# Patient Record
Sex: Female | Born: 1985 | Race: White | Hispanic: No | Marital: Married | Smoking: Never smoker
Health system: Southern US, Community
[De-identification: ages and names within clinical notes are randomized; demographics above are authoritative.]

---

## 2012-12-25 ENCOUNTER — Ambulatory Visit: Payer: Self-pay | Admitting: Otolaryngology

## 2015-02-02 ENCOUNTER — Ambulatory Visit: Payer: Self-pay | Admitting: Neurology

## 2015-06-29 ENCOUNTER — Other Ambulatory Visit: Payer: Self-pay | Admitting: Unknown Physician Specialty

## 2015-06-29 DIAGNOSIS — J329 Chronic sinusitis, unspecified: Secondary | ICD-10-CM

## 2015-07-20 ENCOUNTER — Ambulatory Visit: Payer: Self-pay

## 2015-07-20 ENCOUNTER — Emergency Department
Admission: EM | Admit: 2015-07-20 | Discharge: 2015-07-20 | Disposition: A | Discharge status: Home / Self Care | Payer: BC Managed Care – PPO | Attending: Emergency Medicine | Admitting: Emergency Medicine

## 2015-07-20 ENCOUNTER — Encounter: Payer: Self-pay | Admitting: Emergency Medicine

## 2015-07-20 DIAGNOSIS — R079 Chest pain, unspecified: Secondary | ICD-10-CM | POA: Diagnosis not present

## 2015-07-20 DIAGNOSIS — F419 Anxiety disorder, unspecified: Secondary | ICD-10-CM | POA: Diagnosis not present

## 2015-07-20 LAB — CBC
HCT: 37.5 % (ref 35.0–47.0)
Hemoglobin: 12.7 g/dL (ref 12.0–16.0)
MCH: 29.8 pg (ref 26.0–34.0)
MCHC: 33.8 g/dL (ref 32.0–36.0)
MCV: 88.1 fL (ref 80.0–100.0)
Platelets: 263 10*3/uL (ref 150–440)
RBC: 4.26 MIL/uL (ref 3.80–5.20)
RDW: 13.8 % (ref 11.5–14.5)
WBC: 5.7 10*3/uL (ref 3.6–11.0)

## 2015-07-20 LAB — BASIC METABOLIC PANEL
Anion gap: 6 (ref 5–15)
BUN: 10 mg/dL (ref 6–20)
CALCIUM: 9.1 mg/dL (ref 8.9–10.3)
CO2: 27 mmol/L (ref 22–32)
CREATININE: 0.87 mg/dL (ref 0.44–1.00)
Chloride: 106 mmol/L (ref 101–111)
GFR calc Af Amer: >60 mL/min (ref >60)
GLUCOSE: 106 mg/dL — AB (ref 65–99)
Potassium: 3.6 mmol/L (ref 3.5–5.1)
Sodium: 139 mmol/L (ref 135–145)

## 2015-07-20 LAB — TROPONIN I

## 2015-07-20 LAB — FIBRIN DERIVATIVES D-DIMER (ARMC ONLY): Fibrin derivatives D-dimer (ARMC): 160 (ref 0–499)

## 2015-07-20 LAB — TSH: TSH: 1.979 u[IU]/mL (ref 0.350–4.500)

## 2015-07-20 MED ORDER — LORAZEPAM 0.5 MG PO TABS: 0.5000 mg | ORAL_TABLET | Freq: Three times a day (TID) | ORAL | Status: DC | PRN

## 2015-07-20 NOTE — ED Provider Notes (Signed)
Time Seen: Approximately.[facility]  I have reviewed the triage notes  Chief Complaint: Chest Pain and Numbness   History of Present Illness: Tricia Moran is a 29 y.o. female who presents with a 6 day history of intermittent to constant chest discomfort. She points mainly to the substernal region. She states that the chest discomfort seems to get worse when she gets anxious about her daughter and her speech development. 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 She denies any arm job back or flank discomfort. She denies any nausea, vomiting, shortness of breath, fever, chills, cough. She denies any obvious pleuritic or positional components to her pain. She states she's been prescribed antidepressants etc. in the past but would prefer to use natural supplements. She denies any pulmonary emboli risk factors  History reviewed. No pertinent past medical history. Negative for hypertension, high cholesterol, or diabetes There are no active problems to display for this patient.   History reviewed. No pertinent past surgical history.  History reviewed. No pertinent past surgical history.  No current outpatient prescriptions on file.  Allergies:  Review of patient's allergies indicates not on file.  Family History: No family history on file. No early cardiovascular disease Social History: Social History  Substance Use Topics  . Smoking status: Never Smoker   . Smokeless tobacco: None  . Alcohol Use: Yes     Review of Systems:   10 point review of systems was performed and was otherwise negative:  Constitutional: No fever Eyes: No visual disturbances ENT: No sore throat, ear pain Cardiac: No chest pain Respiratory: No shortness of breath, wheezing, or stridor Abdomen: No abdominal pain, no vomiting, No diarrhea Endocrine: No weight loss, No night sweats Extremities: No peripheral edema,  cyanosis Skin: No rashes, easy bruising Neurologic: No focal weakness, trouble with speech or swollowing Urologic: No dysuria, Hematuria, or urinary frequency   Physical Exam:  ED Triage Vitals  Enc Vitals Group     BP 07/20/15 0136 118/81 mmHg     Pulse Rate 07/20/15 0136 76     Resp 07/20/15 0136 16     Temp 07/20/15 0136 97.5 F (36.4 C)     Temp Source 07/20/15 0136 Oral     SpO2 07/20/15 0136 98 %     Weight 07/20/15 0136 150 lb (68.04 kg)     Height 07/20/15 0136  (1.854 m)     Head Cir --      Peak Flow --      Pain Score 07/20/15 0133 6     Pain Loc --      Pain Edu? --      Excl. in GC? --     General: Awake , Alert , and Oriented times 3; GCS 15. Anxious with forced speech Head: Normal cephalic , atraumatic Eyes: Pupils equal , round, reactive to light Nose/Throat: No nasal drainage, patent upper airway without erythema or exudate.  Neck: Supple, Full range of motion, No anterior adenopathy or palpable thyroid masses Lungs: Clear to ascultation without wheezes , rhonchi, or rales Heart: Regular rate, regular rhythm without murmurs , gallops , or rubs Abdomen: Soft, non tender without rebound, guarding , or rigidity; bowel sounds positive and symmetric in all 4 quadrants. No organomegaly .        Extremities: 2 plus symmetric pulses. No edema, clubbing or cyanosis Neurologic: normal ambulation, Motor symmetric without deficits, sensory intact  Skin: warm, dry, no rashes   Labs:   All laboratory work was reviewed including any pertinent negatives or positives listed below:  Labs Reviewed  BASIC METABOLIC PANEL - Abnormal; Notable for the following:    Glucose, Bld 106 (*)    All other components within normal limits  CBC  TROPONIN I  FIBRIN DERIVATIVES D-DIMER ([facility] ONLY)  TSH    EKG: ED ECG REPORT I, Jennye Moccasin, the attending physician, personally viewed and interpreted this ECG.  Date: 07/20/2015 EKG Time: 0135 Rate: 70 Rhythm: normal sinus  rhythm QRS Axis: Incomplete right bundle branch block pattern Intervals: normal ST/T Wave abnormalities: normal Conduction Disutrbances: none Narrative Interpretation: unremarkable No acute ischemic changes     ED Course:  Differential includes all life-threatening causes for chest pain. This includes but is not exclusive to acute coronary syndrome, aortic dissection, pulmonary embolism, cardiac tamponade, community-acquired pneumonia, pericarditis, musculoskeletal chest wall pain, etc.  Given the patient's current clinical presentation and objective findings I felt most likely this was anxiety related. Patient be prescribed Ativan on an outpatient basis and was given instructions on the medications.   Assessment: Acute unspecified chest pain Anxiety   Final Clinical Impression:  Acute unspecified chest pain Anxiety Final diagnoses:  None     Plan: Patient was advised to return immediately if condition worsens. Patient was advised to follow up with her primary care physician or other specialized physicians involved and in their current assessment.             Jennye Moccasin, MD 07/20/15 [phone]

## 2015-07-20 NOTE — ED Notes (Signed)
Patient presents with c/o retrosternal CP x 2 days. Pain does no radiate. (+) LUE numbness. Denies N/V, SOB, and diaphoresis.

## 2015-07-20 NOTE — ED Notes (Signed)
Patient presents to ED with c/o chest pain since Friday. Reports had a stressful trip to [place] and has been stressed this week with her job. Patient reports chest pain has been increasing, reports left arm numbness and "heaviness." Patient reports history of same, reports symptoms occur when patient is in a stressful situation. Patient reports "I was nauseous earlier and that is what made me come to hospital." Denies abdominal pain, shortness of breath, dizziness, or other complaints at this time. Patient alert and oriented x 4, respirations even and unlabored.

## 2015-07-20 NOTE — ED Notes (Signed)

## 2015-07-23 ENCOUNTER — Emergency Department
Admission: EM | Admit: 2015-07-23 | Discharge: 2015-07-23 | Discharge status: Against Medical Advice | Payer: BC Managed Care – PPO | Attending: Emergency Medicine | Admitting: Emergency Medicine

## 2015-07-23 DIAGNOSIS — F419 Anxiety disorder, unspecified: Secondary | ICD-10-CM | POA: Diagnosis not present

## 2015-07-26 ENCOUNTER — Other Ambulatory Visit: Payer: Self-pay

## 2015-07-26 ENCOUNTER — Emergency Department: Payer: BC Managed Care – PPO

## 2015-07-26 ENCOUNTER — Emergency Department
Admission: EM | Admit: 2015-07-26 | Discharge: 2015-07-26 | Disposition: A | Discharge status: Home / Self Care | Payer: BC Managed Care – PPO | Attending: Emergency Medicine | Admitting: Emergency Medicine

## 2015-07-26 ENCOUNTER — Encounter: Payer: Self-pay | Admitting: *Deleted

## 2015-07-26 DIAGNOSIS — R200 Anesthesia of skin: Secondary | ICD-10-CM | POA: Diagnosis not present

## 2015-07-26 DIAGNOSIS — Z791 Long term (current) use of non-steroidal anti-inflammatories (NSAID): Secondary | ICD-10-CM | POA: Insufficient documentation

## 2015-07-26 DIAGNOSIS — R51 Headache: Secondary | ICD-10-CM | POA: Insufficient documentation

## 2015-07-26 DIAGNOSIS — R079 Chest pain, unspecified: Secondary | ICD-10-CM | POA: Diagnosis not present

## 2015-07-26 DIAGNOSIS — F419 Anxiety disorder, unspecified: Secondary | ICD-10-CM | POA: Diagnosis not present

## 2015-07-26 DIAGNOSIS — Z79899 Other long term (current) drug therapy: Secondary | ICD-10-CM | POA: Diagnosis not present

## 2015-07-26 LAB — BASIC METABOLIC PANEL
Anion gap: 6 (ref 5–15)
BUN: 7 mg/dL (ref 6–20)
CO2: 29 mmol/L (ref 22–32)
Calcium: 9.3 mg/dL (ref 8.9–10.3)
Chloride: 105 mmol/L (ref 101–111)
Creatinine, Ser: 0.85 mg/dL (ref 0.44–1.00)
GFR calc Af Amer: >60 mL/min (ref >60)
GLUCOSE: 102 mg/dL — AB (ref 65–99)
POTASSIUM: 3.8 mmol/L (ref 3.5–5.1)
Sodium: 140 mmol/L (ref 135–145)

## 2015-07-26 LAB — CBC
HEMATOCRIT: 39.3 % (ref 35.0–47.0)
Hemoglobin: 13.4 g/dL (ref 12.0–16.0)
MCH: 30.3 pg (ref 26.0–34.0)
MCHC: 34.1 g/dL (ref 32.0–36.0)
MCV: 88.8 fL (ref 80.0–100.0)
Platelets: 281 10*3/uL (ref 150–440)
RBC: 4.42 MIL/uL (ref 3.80–5.20)
RDW: 14.1 % (ref 11.5–14.5)
WBC: 5.5 10*3/uL (ref 3.6–11.0)

## 2015-07-26 LAB — TROPONIN I: Troponin I: <0.03 ng/mL (ref <0.031)

## 2015-07-26 NOTE — Discharge Instructions (Signed)
You have been seen in the Emergency Department (ED) today for chest pain.  As we have discussed todays test results are normal, but you may require further testing.  Please follow up with the recommended doctor as instructed above in these documents regarding todays emergent visit and your recent symptoms to discuss further management.  Continue to take your regular medications. If you are not doing so already, please also take a daily baby aspirin (81 mg), at least until you follow up with your doctor.  Return to the Emergency Department (ED) if you experience any further chest pain/pressure/tightness, difficulty breathing, or sudden sweating, or other symptoms that concern you.   It is often hard to give a specific diagnosis for the cause of chest pain. There is always a chance that your pain could be related to something serious, such as a heart attack or a blood clot in the lungs. You need to follow up with your health care provider for further evaluation. CAUSES   Heartburn.  Pneumonia or bronchitis.  Anxiety or stress.  Inflammation around your heart (pericarditis) or lung (pleuritis or pleurisy).  A blood clot in the lung.  A collapsed lung (pneumothorax). It can develop suddenly on its own (spontaneous pneumothorax) or from trauma to the chest.  Shingles infection (herpes zoster virus). The chest wall is composed of bones, muscles, and cartilage. Any of these can be the source of the pain.  The bones can be bruised by injury.  The muscles or cartilage can be strained by coughing or overwork.  The cartilage can be affected by inflammation and become sore (costochondritis). DIAGNOSIS  Lab tests or other studies may be needed to find the cause of your pain. Your health care provider may have you take a test called an ambulatory electrocardiogram (ECG). An ECG records your heartbeat patterns over a 24-hour period. You may also have other tests, such as:  Transthoracic  echocardiogram (TTE). During echocardiography, sound waves are used to evaluate how blood flows through your heart.  Transesophageal echocardiogram (TEE).  Cardiac monitoring. This allows your health care provider to monitor your heart rate and rhythm in real time.  Holter monitor. This is a portable device that records your heartbeat and can help diagnose heart arrhythmias. It allows your health care provider to track your heart activity for several days, if needed.  Stress tests by exercise or by giving medicine that makes the heart beat faster. TREATMENT   Treatment depends on what may be causing your chest pain. Treatment may include:  Acid blockers for heartburn.  Anti-inflammatory medicine.  Pain medicine for inflammatory conditions.  Antibiotics if an infection is present.  You may be advised to change lifestyle habits. This includes stopping smoking and avoiding alcohol, caffeine, and chocolate.  You may be advised to keep your head raised (elevated) when sleeping. This reduces the chance of acid going backward from your stomach into your esophagus. Most of the time, nonspecific chest pain will improve within 2-3 days with rest and mild pain medicine.  HOME CARE INSTRUCTIONS   If antibiotics were prescribed, take them as directed. Finish them even if you start to feel better.  For the next few days, avoid physical activities that bring on chest pain. Continue physical activities as directed.  Do not use any tobacco products, including cigarettes, chewing tobacco, or electronic cigarettes.  Avoid drinking alcohol.  Only take medicine as directed by your health care provider.  Follow your health care provider's suggestions for further testing if  your chest pain does not go away.  Keep any follow-up appointments you made. If you do not go to an appointment, you could develop lasting (chronic) problems with pain. If there is any problem keeping an appointment, call to  reschedule. SEEK MEDICAL CARE IF:   Your chest pain does not go away, even after treatment.  You have a rash with blisters on your chest.  You have a fever. SEEK IMMEDIATE MEDICAL CARE IF:   You have increased chest pain or pain that spreads to your arm, neck, jaw, back, or abdomen.  You have shortness of breath.  You have an increasing cough, or you cough up blood.  You have severe back or abdominal pain.  You feel nauseous or vomit.  You have severe weakness.  You faint.  You have chills. This is an emergency. Do not wait to see if the pain will go away. Get medical help at once. Call your local emergency services (911 in [place] Do not drive yourself to the hospital. MAKE SURE YOU:   Understand these instructions.  Will watch your condition.  Will get help right away if you are not doing well or get worse. Document Released: 08/21/2005 Document Revised: 11/16/2013 Document Reviewed: 06/16/2008 [facility] Patient Information 2015 [place], [place]. This information is not intended to replace advice given to you by your health care provider. Make sure you discuss any questions you have with your health care provider.

## 2015-07-26 NOTE — ED Provider Notes (Addendum)
[facility] Emergency Department Provider Note  ____________________________________________  Time seen: Approximately 8:07 AM  I have reviewed the triage vital signs and the nursing notes.   HISTORY  Chief Complaint Chest Pain    HPI Tricia Moran is a 29 y.o. female who reports that she has a history of a possible parasite, she's been fighting with abnormal symptoms and a cyst in her brain along with other symptoms for approximately 3 years. She reports to me that for about the last week and a half she's noticed that when she lays down at night she feels a bubbling sensation coming up from her stomach like a fluid level moving into the upper chest. This bothers her mostly at night, she doesn't notice it during the day. She denies symptoms or pain now.  The patient does report to me that she believes this all started 3 years ago with a chief rash on her face that she encountered while in [place], this came back while she was in [place] 3 years ago. She also tells me that she is seen near nose and throat as well as neurology and been referred to psychology who she saw yesterday.      Pulmonary Embolism Rule-out Criteria (PERC rule)                        If YES to ANY of the following, the PERC rule is not satisfied and cannot be used to rule out PE in this patient (consider d-dimer or imaging depending on pre-test probability).                      If NO to ALL of the following, AND the clinician's pre-test probability is <15%, the [facility] rule is satisfied and there is no need for further workup (including no need to obtain a d-dimer) as the post-test probability of pulmonary embolism is <2%.                      Mnemonic is HAD CLOTS   H - hormone use (exogenous estrogen)      No. A - age > 50                                                 No. D - DVT/PE history                                      No.   C - coughing blood (hemoptysis)                  No. L - leg swelling, unilateral                             No. O - O2 Sat on Room Air < 95%                  No. T - tachycardia (HR ? 100)                         No. S - surgery or trauma, recent  No.   Based on my evaluation of the patient, including application of this decision instrument, further testing to evaluate for pulmonary embolism is not indicated at this time. I have discussed this recommendation with the patient who states understanding and agreement with this plan.   History reviewed. No pertinent past medical history.  There are no active problems to display for this patient.   History reviewed. No pertinent past surgical history.  Current Outpatient Rx  Name  Route  Sig  Dispense  Refill  . azelastine (OPTIVAR) 0.05 % ophthalmic solution   Both Eyes   Place 1 drop into both eyes daily.         . fluticasone (FLONASE) 50 MCG/ACT nasal spray   Each Nare   Place 2 sprays into both nostrils daily.         Marland Kitchen levocetirizine (XYZAL) 5 MG tablet   Oral   Take 1 tablet by mouth daily.         Marland Kitchen LORazepam (ATIVAN) 0.5 MG tablet   Oral   Take 1 tablet (0.5 mg total) by mouth every 8 (eight) hours as needed for anxiety.   30 tablet   0   . montelukast (SINGULAIR) 10 MG tablet   Oral   Take 1 tablet by mouth daily.         . predniSONE (STERAPRED UNI-PAK 21 TAB) 10 MG (21) TBPK tablet   Oral   Take 1 tablet by mouth taper from 4 doses each day to 1 dose and stop.         Marland Kitchen QVAR 40 MCG/ACT inhaler   Inhalation   Inhale 2 puffs into the lungs 2 (two) times daily.           Dispense as written.     Allergies Review of patient's allergies indicates no known allergies.  No family history on file.  Social History Social History  Substance Use Topics  . Smoking status: Never Smoker   . Smokeless tobacco: None  . Alcohol Use: Yes   Patient reports no family history of early onset coronary disease.  Review of  Systems Constitutional: No fever/chills Eyes: No visual changes. ENT: No sore throat. Cardiovascular: See history of present illness Respiratory: Denies shortness of breath. Gastrointestinal: No abdominal pain.  No nausea, no vomiting.  No diarrhea.  No constipation. Genitourinary: Negative for dysuria. Musculoskeletal: Negative for back pain. Skin: Negative for rash. Neurological: Patient reports frequent headaches over the last 3 years, a numbness that comes and goes over the left side of her face for approximately 3 years, she reports is due to a "cyst" which was diagnosed by Dr. Sherryll Burger neurology in the brain. No numbness or tingling the hands or legs.  Patient reports easy bruising for the last 3 years  10-point ROS otherwise negative.  ____________________________________________   PHYSICAL EXAM:  VITAL SIGNS: ED Triage Vitals  Enc Vitals Group     BP 07/26/15 0800 129/92 mmHg     Pulse Rate 07/26/15 0728 102     Resp 07/26/15 0728 20     Temp 07/26/15 0728 97.9 F (36.6 C)     Temp Source 07/26/15 0728 Oral     SpO2 07/26/15 0728 97 %     Weight 07/26/15 0728 158 lb (71.668 kg)     Height 07/26/15 0728  (1.854 m)     Head Cir --      Peak Flow --      Pain Score 07/26/15 0730  8     Pain Loc --      Pain Edu? --      Excl. in GC? --     Constitutional: Alert and oriented. Well appearing and in no acute distress. Eyes: Conjunctivae are normal. PERRL. EOMI. Head: Atraumatic. Nose: No congestion/rhinnorhea. Mouth/Throat: Mucous membranes are moist.  Oropharynx non-erythematous. Neck: No stridor.   Cardiovascular: Normal rate, regular rhythm. Grossly normal heart sounds.  Good peripheral circulation. Respiratory: Normal respiratory effort.  No retractions. Lungs CTAB. Gastrointestinal: Soft and nontender. No distention. No abdominal bruits. No CVA tenderness. Musculoskeletal: No lower extremity tenderness nor edema.  No joint effusions. 5 out of 5 strength in all  extremities with normal sensation in all extremities. Neurologic:  Normal speech and language. No gross focal neurologic deficits are appreciated. No gait instability. Skin:  Skin is warm, dry and intact. No rash noted. Psychiatric: Mood and affect are lightly elevated, patient is very anxious especially when she is speaking frequently about a possible parasite. Speech  normal.  ____________________________________________   LABS (all labs ordered are listed, but only abnormal results are displayed)  Labs Reviewed  BASIC METABOLIC PANEL - Abnormal; Notable for the following:    Glucose, Bld 102 (*)    All other components within normal limits  CBC  TROPONIN I   ____________________________________________  EKG  Reviewed and interpreted by me Ventricular rate 80 Incomplete right bundle-branch block otherwise no acute abnormality. No ischemic changes. Normal T waves. QTc 420 QRS 110 PR interval 140  Reviewed and interpreted as right bundle-branch block without ischemic change. ____________________________________________  RADIOLOGY   ____________________________________________   PROCEDURES  Procedure(s) performed: None  Critical Care performed: No  ____________________________________________   INITIAL IMPRESSION / ASSESSMENT AND PLAN / ED COURSE  Pertinent labs & imaging results that were available during my care of the patient were reviewed by me and considered in my medical decision making (see chart for details).  Patient presents with very atypical chest symptoms. Normal exam. She is fixated on a possible parasite or other condition which started approximately 3 years ago causing symptoms today. Her symptoms do not match coronary disease. She is very low risk for heart disease, and her heart score is low risk.  Discussed with Dr. Sherryll Burger. Patient has a normal arachnoid cyst, and notes he has discussed many many times with patient that this is not an acute  abnormality and doesn't cause symptoms. Dr. Sherryll Burger notes "nothing organic" has been found to explain her symptoms.  The patient has established psychiatric care as of yesterday at [facility], I discussed with the patient her results today are very reassuring and I have referred her back to her primary care physician as well as to her psychiatrist for ongoing care. Did provide her a phone number for cardiology to set up outpatient follow-up. Careful return precautions advised. ____________________________________________  ----------------------------------------- 9:08 AM on 07/26/2015 -----------------------------------------  Chest x-ray read as normal. Patient remains asymptomatic. She is going to see Dr. Clelia Croft today at 9:30 AM as a regularly scheduled appointment.    FINAL CLINICAL IMPRESSION(S) / ED DIAGNOSES  Final diagnoses:  Chest pain with low risk for cardiac etiology      Sharyn Creamer, MD 07/26/15 0830  Sharyn Creamer, MD 07/26/15 [phone]

## 2015-07-26 NOTE — ED Notes (Signed)
Pt has been to ER 3 times this week, pt appears anxious in triage, pt reports multiple medical problems being seen by several doctors, pt reports recurrent chest pain

## 2015-08-04 ENCOUNTER — Ambulatory Visit: Payer: Self-pay | Admitting: Urology

## 2015-08-04 ENCOUNTER — Encounter: Payer: Self-pay | Admitting: Urology

## 2015-08-09 ENCOUNTER — Ambulatory Visit: Payer: Self-pay

## 2015-08-10 ENCOUNTER — Encounter: Payer: Self-pay | Admitting: Urology

## 2015-08-10 ENCOUNTER — Ambulatory Visit: Payer: Self-pay | Admitting: Urology

## 2015-09-06 ENCOUNTER — Encounter: Payer: Self-pay | Admitting: Emergency Medicine

## 2015-09-06 ENCOUNTER — Emergency Department
Admission: EM | Admit: 2015-09-06 | Discharge: 2015-09-06 | Disposition: A | Discharge status: Home / Self Care | Payer: BC Managed Care – PPO | Attending: Emergency Medicine | Admitting: Emergency Medicine

## 2015-09-06 ENCOUNTER — Emergency Department: Payer: BC Managed Care – PPO

## 2015-09-06 DIAGNOSIS — R51 Headache: Secondary | ICD-10-CM | POA: Insufficient documentation

## 2015-09-06 DIAGNOSIS — R519 Headache, unspecified: Secondary | ICD-10-CM

## 2015-09-06 DIAGNOSIS — R202 Paresthesia of skin: Secondary | ICD-10-CM | POA: Insufficient documentation

## 2015-09-06 DIAGNOSIS — R451 Restlessness and agitation: Secondary | ICD-10-CM | POA: Diagnosis not present

## 2015-09-06 DIAGNOSIS — Z3202 Encounter for pregnancy test, result negative: Secondary | ICD-10-CM | POA: Diagnosis not present

## 2015-09-06 DIAGNOSIS — F419 Anxiety disorder, unspecified: Secondary | ICD-10-CM | POA: Diagnosis not present

## 2015-09-06 DIAGNOSIS — R200 Anesthesia of skin: Secondary | ICD-10-CM | POA: Insufficient documentation

## 2015-09-06 DIAGNOSIS — F22 Delusional disorders: Secondary | ICD-10-CM | POA: Diagnosis not present

## 2015-09-06 DIAGNOSIS — Z79899 Other long term (current) drug therapy: Secondary | ICD-10-CM | POA: Diagnosis not present

## 2015-09-06 LAB — CBC WITH DIFFERENTIAL/PLATELET
Basophils Absolute: 0 10*3/uL (ref 0–0.1)
Basophils Relative: 1 %
Eosinophils Absolute: 0.1 10*3/uL (ref 0–0.7)
Eosinophils Relative: 2 %
HEMATOCRIT: 37.7 % (ref 35.0–47.0)
HEMOGLOBIN: 12.9 g/dL (ref 12.0–16.0)
LYMPHS ABS: 1.4 10*3/uL (ref 1.0–3.6)
Lymphocytes Relative: 22 %
MCH: 30.3 pg (ref 26.0–34.0)
MCHC: 34.3 g/dL (ref 32.0–36.0)
MCV: 88.4 fL (ref 80.0–100.0)
MONO ABS: 0.6 10*3/uL (ref 0.2–0.9)
MONOS PCT: 9 %
NEUTROS ABS: 4.3 10*3/uL (ref 1.4–6.5)
NEUTROS PCT: 66 %
Platelets: 262 10*3/uL (ref 150–440)
RBC: 4.26 MIL/uL (ref 3.80–5.20)
RDW: 13.4 % (ref 11.5–14.5)
WBC: 6.4 10*3/uL (ref 3.6–11.0)

## 2015-09-06 LAB — COMPREHENSIVE METABOLIC PANEL
ALBUMIN: 4.5 g/dL (ref 3.5–5.0)
ALK PHOS: 43 U/L (ref 38–126)
ALT: 13 U/L — AB (ref 14–54)
AST: 14 U/L — ABNORMAL LOW (ref 15–41)
Anion gap: 6 (ref 5–15)
BUN: 8 mg/dL (ref 6–20)
CALCIUM: 9.3 mg/dL (ref 8.9–10.3)
CHLORIDE: 104 mmol/L (ref 101–111)
CO2: 28 mmol/L (ref 22–32)
CREATININE: 0.8 mg/dL (ref 0.44–1.00)
GFR calc Af Amer: >60 mL/min (ref >60)
Glucose, Bld: 100 mg/dL — ABNORMAL HIGH (ref 65–99)
Potassium: 3.5 mmol/L (ref 3.5–5.1)
SODIUM: 138 mmol/L (ref 135–145)
Total Bilirubin: 0.8 mg/dL (ref 0.3–1.2)
Total Protein: 7.7 g/dL (ref 6.5–8.1)

## 2015-09-06 LAB — URINE DRUG SCREEN, QUALITATIVE (ARMC ONLY)
Amphetamines, Ur Screen: NOT DETECTED
BARBITURATES, UR SCREEN: NOT DETECTED
Benzodiazepine, Ur Scrn: NOT DETECTED
CANNABINOID 50 NG, UR ~~LOC~~: NOT DETECTED
COCAINE METABOLITE, UR ~~LOC~~: NOT DETECTED
MDMA (Ecstasy)Ur Screen: NOT DETECTED
Methadone Scn, Ur: NOT DETECTED
OPIATE, UR SCREEN: NOT DETECTED
PHENCYCLIDINE (PCP) UR S: NOT DETECTED
Tricyclic, Ur Screen: NOT DETECTED

## 2015-09-06 LAB — URINALYSIS COMPLETE WITH MICROSCOPIC (ARMC ONLY)
BACTERIA UA: NONE SEEN
Bilirubin Urine: NEGATIVE
GLUCOSE, UA: NEGATIVE mg/dL
HGB URINE DIPSTICK: NEGATIVE
Ketones, ur: NEGATIVE mg/dL
Leukocytes, UA: NEGATIVE
Nitrite: NEGATIVE
Protein, ur: NEGATIVE mg/dL
Specific Gravity, Urine: 1.017 (ref 1.005–1.030)
pH: 7 (ref 5.0–8.0)

## 2015-09-06 LAB — POCT PREGNANCY, URINE: PREG TEST UR: NEGATIVE

## 2015-09-06 LAB — ETHANOL

## 2015-09-06 LAB — SALICYLATE LEVEL: Salicylate Lvl: <4 mg/dL (ref 2.8–30.0)

## 2015-09-06 LAB — PREGNANCY, URINE: PREG TEST UR: NEGATIVE

## 2015-09-06 LAB — ACETAMINOPHEN LEVEL: Acetaminophen (Tylenol), Serum: <10 ug/mL — ABNORMAL LOW (ref 10–30)

## 2015-09-06 MED ORDER — TRAZODONE HCL 100 MG PO TABS
100.0000 mg | ORAL_TABLET | Freq: Every day | ORAL | Status: DC
  Administered 2015-09-06: 100 mg via ORAL
  Filled 2015-09-06: qty 1

## 2015-09-06 MED ORDER — SODIUM CHLORIDE 0.9 % IV BOLUS (SEPSIS)
500.0000 mL | Freq: Once | INTRAVENOUS | Status: AC
  Administered 2015-09-06: 500 mL via INTRAVENOUS

## 2015-09-06 MED ORDER — METOCLOPRAMIDE HCL 5 MG/ML IJ SOLN
5.0000 mg | Freq: Once | INTRAMUSCULAR | Status: AC
  Administered 2015-09-06: 5 mg via INTRAVENOUS
  Filled 2015-09-06: qty 2
  Filled 2015-09-06: qty 1

## 2015-09-06 MED ORDER — LORAZEPAM 2 MG/ML IJ SOLN
INTRAMUSCULAR | Status: AC
  Filled 2015-09-06: qty 1

## 2015-09-06 MED ORDER — MAGNESIUM SULFATE 2 GM/50ML IV SOLN
2.0000 g | Freq: Once | INTRAVENOUS | Status: AC
  Administered 2015-09-06: 2 g via INTRAVENOUS
  Filled 2015-09-06 (×2): qty 50

## 2015-09-06 MED ORDER — LORAZEPAM 2 MG/ML IJ SOLN
0.5000 mg | Freq: Once | INTRAMUSCULAR | Status: AC
  Administered 2015-09-06: 0.5 mg via INTRAVENOUS

## 2015-09-06 MED ORDER — LORAZEPAM 1 MG PO TABS: 1.0000 mg | ORAL_TABLET | Freq: Three times a day (TID) | ORAL | Status: DC | PRN

## 2015-09-06 NOTE — ED Notes (Signed)
Says has had sx of headache and head pressure and facial swelling for couple years, but today was boiling water and found some white substance in bowl. Has this with her.Benita Stabileaklso says her daughter has neuro sx for long time and she is worried it is due to the substance in the bowl.

## 2015-09-06 NOTE — ED Notes (Signed)
Pt states was cleaning today and emptied the contents of a steam vacuum into a bowl. Pt states she found a white substance that she is concerned that this substance is in her water and making her sick. Pt states she has noticed some facial swelling in her husband and child and she is concerned that they are all getting sick. Pt states she has some "seeds" on the top of her head. Pt states she wants an MRI.

## 2015-09-06 NOTE — Discharge Instructions (Signed)
Your head CT was normal and stable. You do have the arachnoid cyst that has not changed since the MRI in March. We've discussed that your focus on a possible parasitic infection may not be physical, but rather psychiatric. We offered to have a stay in the emergency department to see a psychiatrist tomorrow, but you preferred to go home and follow-up in a clinical setting tomorrow. Go to [facility] for further evaluation. You may take Ativan as needed for anxiety over the next day. Return to the emergency department if your headache worsens or if you have other urgent concerns.  General Headache Without Cause A headache is pain or discomfort felt around the head or neck area. There are many causes and types of headaches. In some cases, the cause may not be found.  HOME CARE  Managing Pain  Take over-the-counter and prescription medicines only as told by your doctor.  Lie down in a dark, quiet room when you have a headache.  If directed, apply ice to the head and neck area:  Put ice in a plastic bag.  Place a towel between your skin and the bag.  Leave the ice on for 20 minutes, 2-3 times per day.  Use a heating pad or hot shower to apply heat to the head and neck area as told by your doctor.  Keep lights dim if bright lights bother you or make your headaches worse. Eating and Drinking  Eat meals on a regular schedule.  Lessen how much alcohol you drink.  Lessen how much caffeine you drink, or stop drinking caffeine. General Instructions  Keep all follow-up visits as told by your doctor. This is important.  Keep a journal to find out if certain things bring on headaches. For example, write down:  What you eat and drink.  How much sleep you get.  Any change to your diet or medicines.  Relax by getting a massage or doing other relaxing activities.  Lessen stress.  Sit up straight. Do not tighten (tense) your muscles.  Do not use tobacco products. This includes cigarettes,  chewing tobacco, or e-cigarettes. If you need help quitting, ask your doctor.  Exercise regularly as told by your doctor.  Get enough sleep. This often means 7-9 hours of sleep. GET HELP IF:  Your symptoms are not helped by medicine.  You have a headache that feels different than the other headaches.  You feel sick to your stomach (nauseous) or you throw up (vomit).  You have a fever. GET HELP RIGHT AWAY IF:   Your headache becomes really bad.  You keep throwing up.  You have a stiff neck.  You have trouble seeing.  You have trouble speaking.  You have pain in the eye or ear.  Your muscles are weak or you lose muscle control.  You lose your balance or have trouble walking.  You feel like you will pass out (faint) or you pass out.  You have confusion.   This information is not intended to replace advice given to you by your health care provider. Make sure you discuss any questions you have with your health care provider.   Document Released: 08/20/2008 Document Revised: 08/02/2015 Document Reviewed: 03/06/2015 [employer] Interactive Patient Education [employer]2016 [employer]

## 2015-09-06 NOTE — ED Provider Notes (Signed)
[facility] Emergency Department Provider Note  ____________________________________________  Time seen:  2040  I have reviewed the triage vital signs and the nursing notes.   HISTORY  Chief Complaint Headache  erratic behavior    HPI Tricia Moran is a 29 y.o. female who presents to the emergency department reporting that she is having severe pain. When she is asked about this headache, she reports "is not exactly headache". She reportsa sharp severe pain down the middle of the top of her head. She also reports that she has some numbness to the left side of her head. The numbness to left side of the head is not new. The headache itself started 2-3 days ago. She is rather hyperverbal, somewhat tangential, and reports that she believes the headache is related to her own erratic behavior, that she is not acting right, neither is her husband or 28-year-old daughter, and she is also worried about her mother and other people in the family. She thinks that this change in behavior and the severe pain she currently has is related to some form of an infection. She mentions having gone to [place] last year and thinks it may be related. She also reports that she has "long stringy things coming out of her nose" and said to other people in her family. Today, she boiled some water and reported into a large bowl. She was going to use this boiling water to clean her vacuum. She noticed they were some of the "long stringy things" in the water similar to what comes out of her nose. She also describes other crawling sensations on her skin.      History reviewed. No pertinent past medical history.  There are no active problems to display for this patient.   No past surgical history on file.  Current Outpatient Rx  Name  Route  Sig  Dispense  Refill  . LORazepam (ATIVAN) 0.5 MG tablet   Oral   Take 1 tablet (0.5 mg total) by mouth every 8 (eight) hours as needed for anxiety.    30 tablet   0   . LORazepam (ATIVAN) 1 MG tablet   Oral   Take 1 tablet (1 mg total) by mouth every 8 (eight) hours as needed for anxiety.   6 tablet   0   . QVAR 40 MCG/ACT inhaler   Inhalation   Inhale 2 puffs into the lungs 2 (two) times daily.           Dispense as written.     Allergies Review of patient's allergies indicates no known allergies.  No family history on file.  Social History Social History  Substance Use Topics  . Smoking status: Never Smoker   . Smokeless tobacco: None  . Alcohol Use: Yes    Review of Systems  Constitutional: Negative for fever. ENT: Negative for sore throat. Cardiovascular: Negative for chest pain. Respiratory: Negative for cough. Gastrointestinal: Negative for abdominal pain, vomiting and diarrhea. Genitourinary: Negative for dysuria. Musculoskeletal: No myalgias or injuries. Skin: Negative for rash. Neurological: Positive for headache and paresthesias. See history of present illness Psychiatric: See history of present illness regarding the patient's psychiatric presentation. Patient does report seeing a psychologist. She reports inability to relax or sleep.  10-point ROS otherwise negative.  ____________________________________________   PHYSICAL EXAM:  VITAL SIGNS: ED Triage Vitals  Enc Vitals Group     BP 09/06/15 1719 137/90 mmHg     Pulse Rate 09/06/15 1719 84  Resp 09/06/15 1719 18     Temp 09/06/15 1719 98.5 F (36.9 C)     Temp Source 09/06/15 1719 Oral     SpO2 09/06/15 1719 100 %     Weight 09/06/15 1719 160 lb (72.576 kg)     Height 09/06/15 1719  (1.854 m)     Head Cir --      Peak Flow --      Pain Score 09/06/15 1721 9     Pain Loc --      Pain Edu? --      Excl. in GC? --     Constitutional:  Alert. Appears little bit agitated, anxious. She is somewhat hyperverbal, speaks quickly, moves quickly. ENT   Head: Normocephalic and atraumatic.   Nose: No congestion/rhinnorhea.     Cardiovascular: Normal rate, regular rhythm, no murmur noted Respiratory:  Normal respiratory effort, no tachypnea.    Breath sounds are clear and equal bilaterally.  Gastrointestinal: Soft and nontender. No distention.  Back: No muscle spasm, no tenderness, no CVA tenderness. Musculoskeletal: No deformity noted. Nontender with normal range of motion in all extremities.  No noted edema. Neurologic: Patient is ambulatory. She has 5 or 5 strength in all 4 extremity is. She has equal grip strength, negative pronator drift, negative Romberg, and good finger to nose motion, although she does this rather quickly. Cranial nerves II through XII are intact. No gross focal neurologic deficits are appreciated.  Skin:  Skin is warm, dry. No rash noted. Psychiatric: Odd affect and presentation. Hyperverbal and tangential. Concerns for some form of a parasitic infection which is affecting her whole family, causing her and the rest of the family to act in a bizarre manner, per her report.  ____________________________________________    LABS (pertinent positives/negatives)  Labs Reviewed  COMPREHENSIVE METABOLIC PANEL - Abnormal; Notable for the following:    Glucose, Bld 100 (*)    AST 14 (*)    ALT 13 (*)    All other components within normal limits  ACETAMINOPHEN LEVEL - Abnormal; Notable for the following:    Acetaminophen (Tylenol), Serum <10 (*)    All other components within normal limits  URINALYSIS COMPLETEWITH MICROSCOPIC ([facility] ONLY) - Abnormal; Notable for the following:    Color, Urine YELLOW (*)    APPearance CLEAR (*)    Squamous Epithelial / LPF 0-5 (*)    All other components within normal limits  ETHANOL  SALICYLATE LEVEL  CBC WITH DIFFERENTIAL/PLATELET  URINE DRUG SCREEN, QUALITATIVE ([facility] ONLY)  PREGNANCY, URINE  POCT PREGNANCY, URINE   ____________________________________________    RADIOLOGY  CT head: IMPRESSION: Stable posterior fossa arachnoid cyst. No intra-axial  mass appreciable. No hemorrhage. No subdural or epidural hematoma. No acute infarct. Gray-white compartments appear normal. 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   ____________________________________________   INITIAL IMPRESSION / ASSESSMENT AND PLAN / ED COURSE  Pertinent labs & imaging results that were available during my care of the patient were reviewed by me and considered in my medical decision making (see chart for details).  This 29 year old female appears agitated, manic, with various concerns for parasitic infection and changes in behavior both for herself and her family. I have called and, with her permission, spoken with her husband [place]. He reports that these traits and behavior has been going on for some time. She's had multiple hospital visits. Some of them were here and  she is also had visits at [facility]. I have reviewed some of the documentation from this other visits and see very similar complaints of stringy parasites in the nose. The outline the extensive multiple evaluations she has had with ENT and with pending examination with infectious disease.  The patient's husband does not believe she is a threat to herself or to others, nor do I. I do think that she is having a psychiatric problem and is currently manic. Her husband reports she has not had any sleep the past 2 nights. We will perform a head CT and basic tests to rule out organic causes. I discussed with the patient staying in the hospital to see a psychiatrist if this evaluation is negative. She seems open at that at this time. Her husband, speaking with me on the phone, is supportive of that option as well.  ----------------------------------------- 10:29 PM on 09/06/2015 -----------------------------------------  CT scan shows stable posterior fossa arachnoid cyst. The CT is otherwise normal. The CT was compared with a brain  MRI of March 2016. I have not been able to find the direct report for this MRI, but I do not think that report is directly relevant or helpful to the patient's clinical situation tonight.   On reexamination, the patient has calmed down a little bit. This isn't part due to the 0.5 mg of Ativan she received and what I suspect is her sleep deprivation. She is calm and communicative at this time. I have offered her further psychiatric evaluation by staying in the emergency department overnight. She would rather not do this and I do not see any grounds for holding her against her will or under an involuntary commitment. This problem has been going on for over 7 months. She and I have spoken at length about my suspicions that this is more likely to be a psychiatric problem then a organic medical problem. She is open to this idea and reports that she is happy to follow up with her psychologist in [place] or with psychiatrist. We will provide her with the follow-up information for [facility].  I have also called her husband back. He agrees with the plan for his wife to return home tonight. He agrees with me that the patient needs to see a mental health professional tomorrow and that they will take to [facility] if they do not have another alternative. He is asked about prescribing antianxiety medicine which I will for the next 2 days.  ____________________________________________   FINAL CLINICAL IMPRESSION(S) / ED DIAGNOSES  Final diagnoses:  Nonintractable headache, unspecified chronicity pattern, unspecified headache type  Delusions of parasitosis      Darien Ramus, MD 09/06/15 2259

## 2015-09-07 ENCOUNTER — Emergency Department
Admission: EM | Admit: 2015-09-07 | Discharge: 2015-09-08 | Disposition: A | Discharge status: Other Acute Inpatient Hospital | Payer: BC Managed Care – PPO | Attending: Emergency Medicine | Admitting: Emergency Medicine

## 2015-09-07 ENCOUNTER — Encounter: Payer: Self-pay | Admitting: *Deleted

## 2015-09-07 DIAGNOSIS — Z046 Encounter for general psychiatric examination, requested by authority: Secondary | ICD-10-CM | POA: Diagnosis present

## 2015-09-07 DIAGNOSIS — R451 Restlessness and agitation: Secondary | ICD-10-CM | POA: Diagnosis not present

## 2015-09-07 DIAGNOSIS — F309 Manic episode, unspecified: Secondary | ICD-10-CM

## 2015-09-07 DIAGNOSIS — F3010 Manic episode without psychotic symptoms, unspecified: Secondary | ICD-10-CM | POA: Diagnosis not present

## 2015-09-07 DIAGNOSIS — Z3202 Encounter for pregnancy test, result negative: Secondary | ICD-10-CM | POA: Diagnosis not present

## 2015-09-07 DIAGNOSIS — F29 Unspecified psychosis not due to a substance or known physiological condition: Secondary | ICD-10-CM | POA: Insufficient documentation

## 2015-09-07 DIAGNOSIS — F419 Anxiety disorder, unspecified: Secondary | ICD-10-CM | POA: Diagnosis not present

## 2015-09-07 DIAGNOSIS — Z7951 Long term (current) use of inhaled steroids: Secondary | ICD-10-CM | POA: Diagnosis not present

## 2015-09-07 LAB — COMPREHENSIVE METABOLIC PANEL
ALK PHOS: 42 U/L (ref 38–126)
ALT: 11 U/L — AB (ref 14–54)
ANION GAP: 6 (ref 5–15)
AST: 13 U/L — ABNORMAL LOW (ref 15–41)
Albumin: 4.4 g/dL (ref 3.5–5.0)
BILIRUBIN TOTAL: 0.4 mg/dL (ref 0.3–1.2)
BUN: 9 mg/dL (ref 6–20)
CALCIUM: 9.1 mg/dL (ref 8.9–10.3)
CO2: 28 mmol/L (ref 22–32)
CREATININE: 0.92 mg/dL (ref 0.44–1.00)
Chloride: 106 mmol/L (ref 101–111)
GFR calc non Af Amer: >60 mL/min (ref >60)
GLUCOSE: 89 mg/dL (ref 65–99)
Potassium: 3.9 mmol/L (ref 3.5–5.1)
SODIUM: 140 mmol/L (ref 135–145)
TOTAL PROTEIN: 7.6 g/dL (ref 6.5–8.1)

## 2015-09-07 LAB — CBC
HCT: 37.6 % (ref 35.0–47.0)
HEMOGLOBIN: 12.6 g/dL (ref 12.0–16.0)
MCH: 29.9 pg (ref 26.0–34.0)
MCHC: 33.5 g/dL (ref 32.0–36.0)
MCV: 89.4 fL (ref 80.0–100.0)
Platelets: 251 10*3/uL (ref 150–440)
RBC: 4.21 MIL/uL (ref 3.80–5.20)
RDW: 13.7 % (ref 11.5–14.5)
WBC: 7.2 10*3/uL (ref 3.6–11.0)

## 2015-09-07 LAB — ACETAMINOPHEN LEVEL: Acetaminophen (Tylenol), Serum: <10 ug/mL — ABNORMAL LOW (ref 10–30)

## 2015-09-07 LAB — URINE DRUG SCREEN, QUALITATIVE (ARMC ONLY)
Amphetamines, Ur Screen: NOT DETECTED
BARBITURATES, UR SCREEN: NOT DETECTED
BENZODIAZEPINE, UR SCRN: NOT DETECTED
CANNABINOID 50 NG, UR ~~LOC~~: NOT DETECTED
COCAINE METABOLITE, UR ~~LOC~~: NOT DETECTED
MDMA (Ecstasy)Ur Screen: NOT DETECTED
Methadone Scn, Ur: NOT DETECTED
OPIATE, UR SCREEN: NOT DETECTED
Phencyclidine (PCP) Ur S: NOT DETECTED
Tricyclic, Ur Screen: NOT DETECTED

## 2015-09-07 LAB — SALICYLATE LEVEL

## 2015-09-07 LAB — PREGNANCY, URINE: Preg Test, Ur: NEGATIVE

## 2015-09-07 LAB — ETHANOL: Alcohol, Ethyl (B): <5 mg/dL (ref <5)

## 2015-09-07 MED ORDER — OLANZAPINE 10 MG PO TABS
10.0000 mg | ORAL_TABLET | Freq: Every day | ORAL | Status: DC
  Administered 2015-09-07: 10 mg via ORAL
  Filled 2015-09-07: qty 1

## 2015-09-07 NOTE — ED Provider Notes (Signed)
[facility] Emergency Department Provider Note  ____________________________________________  Time seen: Approximately 7:55 PM  I have reviewed the triage vital signs and the nursing notes.   HISTORY  Chief Complaint Psychiatric Evaluation  The patient is acutely psychotic/manic  HPI Tricia Moran is a 29 y.o. female who presents under involuntary commitment from [facility].  The patient presented yesterday with complaint of headache but exhibited some psychotic behavior, mania, and delusional behavior.  Dr. Carollee MassedKaminski saw her at that time and worked out a plan with her family for [facility] to [facility].  When [facility] was [facility], they involuntarily committed her and [facility].  The patient appears quite manic during my discussion, with pressured speech, tangential thinking, labile emotions (occasional outbursts of crying without tears, then quickly get herself under control), and rapid speech.  She is also confirming the delusions that were expressed yesterday such as "brown things coming out of my hands", "long stringy things coming out of my nose", and concern about parasites in the water.Her symptoms are severe at this time and she has no insight or judgment into the situation.  According to her husband, the situation is been getting gradually worse over the last 7 months.    History reviewed. No pertinent past medical history.  There are no active problems to display for this patient.   History reviewed. No pertinent past surgical history.  Current Outpatient Rx  Name  Route  Sig  Dispense  Refill  . LORazepam (ATIVAN) 0.5 MG tablet   Oral   Take 1 tablet (0.5 mg total) by mouth every 8 (eight) hours as needed for anxiety.   30 tablet   0   . LORazepam (ATIVAN) 1 MG tablet   Oral   Take 1 tablet (1 mg total) by mouth every 8 (eight) hours as needed for anxiety.   6 tablet   0   . QVAR 40 MCG/ACT inhaler   Inhalation   Inhale 2  puffs into the lungs 2 (two) times daily.           Dispense as written.     Allergies Review of patient's allergies indicates no known allergies.  No family history on file.  Social History Social History  Substance Use Topics  . Smoking status: Never Smoker   . Smokeless tobacco: None  . Alcohol Use: Yes    Review of Systems Constitutional: No fever/chills Eyes: No visual changes. ENT: No sore throat. Cardiovascular: Denies chest pain. Respiratory: Denies shortness of breath. Gastrointestinal: No abdominal pain.  No nausea, no vomiting.  No diarrhea.  No constipation. Genitourinary: Negative for dysuria. Musculoskeletal: Negative for back pain. Skin: Negative for rash. Neurological: Complains of severe splitting headache for months  10-point ROS otherwise negative.  ____________________________________________   PHYSICAL EXAM:  VITAL SIGNS: ED Triage Vitals  Enc Vitals Group     BP 09/07/15 1725 112/86 mmHg     Pulse Rate 09/07/15 1725 81     Resp 09/07/15 1725 16     Temp 09/07/15 1725 97.8 F (36.6 C)     Temp Source 09/07/15 1725 Oral     SpO2 09/07/15 1725 98 %     Weight 09/07/15 1725 160 lb (72.576 kg)     Height 09/07/15 1725 6\' 1"  (1.854 m)     Head Cir --      Peak Flow --      Pain Score 09/07/15 1749 0  Pain Loc --      Pain Edu? --      Excl. in GC? --     Constitutional: Alert and oriented but anxious/agitated, manic and pressured speech. Eyes: Conjunctivae are normal. PERRL. EOMI. Head: Atraumatic. Nose: No congestion/rhinnorhea. Mouth/Throat: Mucous membranes are moist.  Oropharynx non-erythematous. Neck: No stridor.   Cardiovascular: Normal rate, regular rhythm. Grossly normal heart sounds.  Good peripheral circulation. Respiratory: Normal respiratory effort.  No retractions. Lungs CTAB. Gastrointestinal: Soft and nontender. No distention. No abdominal bruits. No CVA tenderness. Musculoskeletal: No lower extremity tenderness  nor edema.  No joint effusions. Neurologic:  Normal speech and language. No gross focal neurologic deficits are appreciated.  Skin:  Skin is warm, dry and intact. No rash noted. Psychiatric: Acute mania with psychotic features as described above.  Denies SI/HI  ____________________________________________   LABS (all labs ordered are listed, but only abnormal results are displayed)  Labs Reviewed  COMPREHENSIVE METABOLIC PANEL - Abnormal; Notable for the following:    AST 13 (*)    ALT 11 (*)    All other components within normal limits  CBC  URINE DRUG SCREEN, QUALITATIVE ([facility] ONLY)  PREGNANCY, URINE  ETHANOL  SALICYLATE LEVEL  ACETAMINOPHEN LEVEL   ____________________________________________  EKG  Not indicated ____________________________________________  RADIOLOGY  Not indicated    ____________________________________________   PROCEDURES  Procedure(s) performed: None  Critical Care performed: No ____________________________________________   INITIAL IMPRESSION / ASSESSMENT AND PLAN / ED COURSE  Pertinent labs & imaging results that were available during my care of the patient were reviewed by me and considered in my medical decision making (see chart for details).  The patient's presentation appears consistent with acute mania with psychotic features.  I will uphold the involuntary commitment at this time.  I will also prescribe a dose of Zyprexa 10 mg by mouth so that she does not continue to suffer acutely during the night; this may help her symptoms in the acute setting.  ____________________________________________  FINAL CLINICAL IMPRESSION(S) / ED DIAGNOSES  Final diagnoses:  Mania (HCC)  Psychosis, unspecified psychosis type      NEW MEDICATIONS STARTED DURING THIS VISIT:  New Prescriptions   No medications on file     Loleta Rose, MD 09/07/15 2012

## 2015-09-07 NOTE — ED Notes (Signed)

## 2015-09-07 NOTE — ED Notes (Signed)
BEHAVIORAL HEALTH ROUNDING Patient sleeping: No. Patient alert and oriented: yes Behavior appropriate: Yes.  ; If no, describe:  Nutrition and fluids offered: Yes  Toileting and hygiene offered: Yes  Sitter present: yes Law enforcement present: Yes  

## 2015-09-07 NOTE — ED Notes (Signed)
Pt arrives under IVC via [employer]. Pt brought here for psychiatric activity for delusional thoughts and behaviors. Pt believes there are parasites in her water and others in her family are also affected.

## 2015-09-07 NOTE — ED Notes (Signed)
ED BHU PLACEMENT JUSTIFICATION Is the patient under IVC or is there intent for IVC: Yes.   Is the patient medically cleared: Yes.   Is there vacancy in the ED BHU: Yes.   Is the population mix appropriate for patient: Yes.   Is the patient awaiting placement in inpatient or outpatient setting: Yes.   Has the patient had a psychiatric consult: Yes.   Survey of unit performed for contraband, proper placement and condition of furniture, tampering with fixtures in bathroom, shower, and each patient room: Yes.  ; Findings:  APPEARANCE/BEHAVIOR calm, cooperative and adequate rapport can be established NEURO ASSESSMENT Orientation: time, place and person Hallucinations: Yes.  Auditory Hallucinations and Visual Hallucinations Speech: Normal Gait: normal RESPIRATORY ASSESSMENT Normal expansion.  Clear to auscultation.  No rales, rhonchi, or wheezing. CARDIOVASCULAR ASSESSMENT regular rate and rhythm, S1, S2 normal, no murmur, click, rub or gallop GASTROINTESTINAL ASSESSMENT soft, nontender, BS WNL, no r/g EXTREMITIES normal strength, tone, and muscle mass PLAN OF CARE Provide calm/safe environment. Vital signs assessed twice daily. ED BHU Assessment once each 12-hour shift. Collaborate with intake RN daily or as condition indicates. Assure the ED provider has rounded once each shift. Provide and encourage hygiene. Provide redirection as needed. Assess for escalating behavior; address immediately and inform ED provider.  Assess family dynamic and appropriateness for visitation as needed: Yes.  ; If necessary, describe findings:  Educate the patient/family about BHU procedures/visitation: Yes.  ; If necessary, describe findings:  

## 2015-09-07 NOTE — ED Notes (Signed)
[facility] Patient care assumed. Patient/RN introduction complete. Will continue to monitor.

## 2015-09-07 NOTE — ED Notes (Signed)
BEHAVIORAL HEALTH ROUNDING Patient sleeping: Yes.   Patient alert and oriented: no Behavior appropriate: Yes.  ; If no, describe:  Nutrition and fluids offered: No Toileting and hygiene offered: No Sitter present: yes Law enforcement present: Yes  

## 2015-09-07 NOTE — ED Notes (Signed)
Pt calm and cooperative at this time, awaiting to see psych in am.

## 2015-09-07 NOTE — BH Assessment (Signed)
Assessment Note  Tricia Moran is a 29 y.o. female presenting to the ED under  IVC via BPD. Pt brought here for psychiatric activity for delusional thoughts and behaviors. Pt believes there are parasites in her water and others in her family are also affected.   Patient presented to the ED yesterday with complaint of headache but exhibited some psychotic behavior, mania, and delusional behavior. Pt was referred to [facility] for follow up.  When she was seen at [facility] today, they involuntarily committed her and sent her back to [facility].  Family reports that her symptoms have been worsening over the past 7 months.  Diagnosis: Psychiatric Evaluation  Past Medical History: History reviewed. No pertinent past medical history.  History reviewed. No pertinent past surgical history.  Family History: No family history on file.  Social History:  reports that she has never smoked. She does not have any smokeless tobacco history on file. She reports that she drinks alcohol. Her drug history is not on file.  Additional Social History:  Alcohol / Drug Use History of alcohol / drug use?: No history of alcohol / drug abuse  CIWA: CIWA-Ar BP: 112/86 mmHg Pulse Rate: 81 COWS:    Allergies: No Known Allergies  Home Medications:  (Not in a hospital admission)  OB/GYN Status:  Patient's last menstrual period was 08/31/2015.  General Assessment Data Location of Assessment: [facility] ED TTS Assessment: In system Is this a Tele or Face-to-Face Assessment?: Face-to-Face Is this an Initial Assessment or a Re-assessment for this encounter?: Initial Assessment Marital status: Married [place] name: Tricia Moran Is patient pregnant?: No Pregnancy Status: No Living Arrangements: Spouse/significant other, Children Can pt return to current living arrangement?: Yes Admission Status: Voluntary Is patient capable of signing voluntary admission?: No Referral Source: Self/Family/Friend Insurance type: Scientist, research (physical sciences)  Exam [facility] Walk-in ONLY) Medical Exam completed: Yes  Crisis Care Plan Living Arrangements: Spouse/significant other, Children Name of Psychiatrist: [facility] Name of Therapist: [facility]  Education Status Is patient currently in school?: No Current Grade: N/A Highest grade of school patient has completed: college Name of school: N/A Contact person: N/A  Risk to self with the past 6 months Suicidal Ideation: No Has patient been a risk to self within the past 6 months prior to admission? : No Suicidal Intent: No Has patient had any suicidal intent within the past 6 months prior to admission? : No Is patient at risk for suicide?: No Suicidal Plan?: No Has patient had any suicidal plan within the past 6 months prior to admission? : No Access to Means: No What has been your use of drugs/alcohol within the last 12 months?: None reported Previous Attempts/Gestures: No How many times?: 0 Other Self Harm Risks: None reported Triggers for Past Attempts: None known Intentional Self Injurious Behavior: None Family Suicide History: Unknown Recent stressful life event(s): Other (Comment) Persecutory voices/beliefs?: No Depression: Yes Depression Symptoms: Tearfulness, Despondent Substance abuse history and/or treatment for substance abuse?: No Suicide prevention information given to non-admitted patients: Not applicable  Risk to Others within the past 6 months Homicidal Ideation: No Does patient have any lifetime risk of violence toward others beyond the six months prior to admission? : No Thoughts of Harm to Others: No Current Homicidal Intent: No Current Homicidal Plan: No Access to Homicidal Means: No Identified Victim: N/A History of harm to others?: No Assessment of Violence: None Noted Violent Behavior Description: N/A Does patient have access to weapons?: No Criminal Charges Pending?: No Does patient have a court date:  No Is patient on probation?: No  Psychosis Hallucinations:  None noted Delusions: Unspecified  Mental Status Report Appearance/Hygiene: In scrubs Eye Contact: Fair Motor Activity: Freedom of movement Speech: Soft, Incoherent Level of Consciousness: Crying Mood: Sad, Despair, Terrified Affect: Anxious, Sad, Inconsistent with thought content Anxiety Level: Severe Thought Processes: Irrelevant Judgement: Impaired Orientation: Person Obsessive Compulsive Thoughts/Behaviors: Moderate  Cognitive Functioning Concentration: Fair Memory: Recent Intact IQ: Average Insight: Poor Impulse Control: Poor Appetite: Poor Weight Loss: 0 Weight Gain: 0 Sleep: Decreased Total Hours of Sleep: 4 Vegetative Symptoms: None  ADLScreening [facility] Assessment Services) Patient's cognitive ability adequate to safely complete daily activities?: Yes Patient able to express need for assistance with ADLs?: Yes Independently performs ADLs?: Yes (appropriate for developmental age)  Prior Inpatient Therapy Prior Inpatient Therapy: No Prior Therapy Dates: N/A Prior Therapy Facilty/Provider(s): N/A Reason for Treatment: N/A  Prior Outpatient Therapy Prior Outpatient Therapy: No Prior Therapy Dates: N/A Prior Therapy Facilty/Provider(s): N/A Reason for Treatment: N/A Does patient have an ACCT team?: No Does patient have Intensive In-House Services?  : No Does patient have [facility] services? : No Does patient have P4CC services?: No  ADL Screening (condition at time of admission) Patient's cognitive ability adequate to safely complete daily activities?: Yes Patient able to express need for assistance with ADLs?: Yes Independently performs ADLs?: Yes (appropriate for developmental age)       Abuse/Neglect Assessment (Assessment to be complete while patient is alone) Physical Abuse: Denies Verbal Abuse: Denies Sexual Abuse: Denies Exploitation of patient/patient's resources: Denies Self-Neglect: Denies Values / Beliefs Cultural Requests During  Hospitalization: None Spiritual Requests During Hospitalization: None Consults Spiritual Care Consult Needed: No Social Work Consult Needed: No Merchant navy officerAdvance Directives (For Healthcare) Does patient have an advance directive?: No Would patient like information on creating an advanced directive?: Yes English as a second language teacher- Educational materials given    Additional Information 1:1 In Past 12 Months?: No CIRT Risk: No Elopement Risk: No Does patient have medical clearance?: Yes     Disposition:  Disposition Initial Assessment Completed for this Encounter: Yes Disposition of Patient: Other dispositions Other disposition(s): Other (Comment) (Psych MD consult)  On Site Evaluation by:   Reviewed with Physician:    Manus Ruddoxana C Legend Pecore 09/07/2015 11:13 PM

## 2015-09-07 NOTE — ED Notes (Signed)

## 2015-09-07 NOTE — ED Notes (Signed)
Pt. transfered to BHU without incident after report from. Placed in room and oriented to unit. Pt. informed that for their safety all care areas are designed for safety and monitored by security cameras at all times; and visiting hours explained to patient. Patient verbalizes understanding, and verbal contract for safety obtained.   

## 2015-09-08 ENCOUNTER — Inpatient Hospital Stay
Admission: EM | Admit: 2015-09-08 | Discharge: 2015-09-15 | DRG: 885 | Disposition: A | Discharge status: Home / Self Care | Payer: BC Managed Care – PPO | Attending: Psychiatry | Admitting: Psychiatry

## 2015-09-08 DIAGNOSIS — G43909 Migraine, unspecified, not intractable, without status migrainosus: Secondary | ICD-10-CM | POA: Diagnosis present

## 2015-09-08 DIAGNOSIS — F449 Dissociative and conversion disorder, unspecified: Secondary | ICD-10-CM | POA: Diagnosis present

## 2015-09-08 DIAGNOSIS — F329 Major depressive disorder, single episode, unspecified: Secondary | ICD-10-CM | POA: Diagnosis present

## 2015-09-08 DIAGNOSIS — F40240 Claustrophobia: Secondary | ICD-10-CM | POA: Diagnosis present

## 2015-09-08 DIAGNOSIS — F22 Delusional disorders: Secondary | ICD-10-CM

## 2015-09-08 DIAGNOSIS — F419 Anxiety disorder, unspecified: Secondary | ICD-10-CM | POA: Diagnosis present

## 2015-09-08 DIAGNOSIS — G4700 Insomnia, unspecified: Secondary | ICD-10-CM | POA: Diagnosis present

## 2015-09-08 DIAGNOSIS — J329 Chronic sinusitis, unspecified: Secondary | ICD-10-CM | POA: Diagnosis present

## 2015-09-08 DIAGNOSIS — R51 Headache: Secondary | ICD-10-CM

## 2015-09-08 DIAGNOSIS — Z7289 Other problems related to lifestyle: Secondary | ICD-10-CM

## 2015-09-08 DIAGNOSIS — K589 Irritable bowel syndrome without diarrhea: Secondary | ICD-10-CM | POA: Diagnosis present

## 2015-09-08 DIAGNOSIS — R519 Headache, unspecified: Secondary | ICD-10-CM

## 2015-09-08 DIAGNOSIS — J45909 Unspecified asthma, uncomplicated: Secondary | ICD-10-CM | POA: Diagnosis present

## 2015-09-08 LAB — TSH: TSH: 1.591 u[IU]/mL (ref 0.350–4.500)

## 2015-09-08 LAB — LIPID PANEL
CHOL/HDL RATIO: 2.1 ratio
CHOLESTEROL: 118 mg/dL (ref 0–200)
HDL: 55 mg/dL (ref >40)
LDL Cholesterol: 50 mg/dL (ref 0–99)
TRIGLYCERIDES: 64 mg/dL (ref <150)
VLDL: 13 mg/dL (ref 0–40)

## 2015-09-08 MED ORDER — ACETAMINOPHEN 325 MG PO TABS
650.0000 mg | ORAL_TABLET | Freq: Four times a day (QID) | ORAL | Status: DC | PRN
  Administered 2015-09-08 – 2015-09-12 (×6): 650 mg via ORAL
  Filled 2015-09-08 (×6): qty 2

## 2015-09-08 MED ORDER — ENSURE ENLIVE PO LIQD
237.0000 mL | Freq: Three times a day (TID) | ORAL | Status: DC
  Administered 2015-09-08 – 2015-09-10 (×5): 237 mL via ORAL

## 2015-09-08 MED ORDER — ALUM & MAG HYDROXIDE-SIMETH 200-200-20 MG/5ML PO SUSP: 30.0000 mL | ORAL | Status: DC | PRN

## 2015-09-08 MED ORDER — IBUPROFEN 800 MG PO TABS: 800.0000 mg | ORAL_TABLET | Freq: Three times a day (TID) | ORAL | Status: DC | PRN

## 2015-09-08 MED ORDER — FLUTICASONE PROPIONATE HFA 44 MCG/ACT IN AERO
2.0000 | INHALATION_SPRAY | Freq: Two times a day (BID) | RESPIRATORY_TRACT | Status: DC
  Filled 2015-09-08: qty 10.6

## 2015-09-08 MED ORDER — BUDESONIDE 0.25 MG/2ML IN SUSP
0.2500 mg | Freq: Two times a day (BID) | RESPIRATORY_TRACT | Status: DC
  Administered 2015-09-08 – 2015-09-14 (×13): 0.25 mg via RESPIRATORY_TRACT
  Filled 2015-09-08 (×15): qty 2

## 2015-09-08 MED ORDER — LORAZEPAM 0.5 MG PO TABS
0.5000 mg | ORAL_TABLET | Freq: Four times a day (QID) | ORAL | Status: DC | PRN
  Administered 2015-09-08 – 2015-09-11 (×3): 0.5 mg via ORAL
  Filled 2015-09-08 (×3): qty 1

## 2015-09-08 MED ORDER — OLANZAPINE 5 MG PO TBDP
10.0000 mg | ORAL_TABLET | Freq: Every day | ORAL | Status: DC
  Administered 2015-09-08: 10 mg via ORAL
  Filled 2015-09-08: qty 2

## 2015-09-08 MED ORDER — BECLOMETHASONE DIPROPIONATE 40 MCG/ACT IN AERS: 1.0000 | INHALATION_SPRAY | Freq: Two times a day (BID) | RESPIRATORY_TRACT | Status: DC

## 2015-09-08 MED ORDER — MAGNESIUM HYDROXIDE 400 MG/5ML PO SUSP: 30.0000 mL | Freq: Every day | ORAL | Status: DC | PRN

## 2015-09-08 NOTE — ED Notes (Signed)
Patient resting comfortably in room. No complaints or concerns voiced. No distress or abnormal behavior noted. Will continue to monitor with security cameras. Q 15 minute rounds continue. 

## 2015-09-08 NOTE — ED Notes (Signed)
Patient asleep in room. No noted distress or abnormal behavior. Will continue 15 minute checks and observation by security cameras for safety. 

## 2015-09-08 NOTE — H&P (Signed)
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 female presenting to the ED under IVC via BPD. Pt brought here for psychiatric activity for delusional thoughts and behaviors. Pt believes there are parasites in her water and others in her family are also affected.  Patient presented to the ED 0n 10/13 with complaint of headache but exhibited delusional thinking. Pt was referred to [facility] for follow up. When she was seen at [facility] today, they involuntarily committed her and sent her back to [facility]. Family reports that her symptoms have been worsening over the past 7 months.  Patient tells me today that for the last 5 months she has developed debilitating headaches. She states that they usually happen in the left side of the head and caused numbness on her left side of the body. She has been taking ibuprofen at home which doesn't help.  She suspects she might be having an infestation of parasites as she has seeing larva (which looks like small poppy seeds) on her scalp, coming out of her nose, vagina and urine.  She also has noted the presence of these larva in her period. Patient has seeing these small things in the water at home. She isn't sure whether this is a parasite, an amoeba or the zika virus. She is states that at times this small larva look like the front and back of a mosquito. Patient saw a neurologist about 2 months ago. He completed an MRI of the brain and EEG that were negative. She visited a second neurologist for a second opinion and is awaiting for a lumbar puncture denies spinal MRI.  Patient also has had stool examination which has been  negative. The patient went to [facility] earlier this week for headaches they discharge her from the emergency department with instructions to take Tylenol and ibuprofen. She came to our emergency department yesterday for headaches and was discharged with a follow-up to [facility] (mental health clinic).  Patient went to [facility] today and based on her presentation they decided to petition her for treatment. Patient believes her husband and daughter are also showing signs of infestation. She stated that her husband behavior has changed and he is more angry.  She said that his face has swelling and that he is losing more hair in the shower. She also states that her daughter face looks swollen.   Patient displays some paranoia during the assessment. She did not give me permission to talk to her husband. She did not wanted to tell me who her neurologist was initially as she wanted [place] to take "a fresh look at her".  Patient also asked me if I was recording the conversation as the speaker in her room when off.  Patient stated that she has been missing work over the last week due to the severe headaches. She was very distressed and anxious during the assessment. She has been calling and then nursing staff constantly telling them that she is blacking out.   Patient denies any history of prior psychiatric treatment. Patient denies any prior history of substance abuse or alcoholism.   Associated Signs/Symptoms: Depression Symptoms:  depressed mood, anxiety, (Hypo) Manic Symptoms:  none Anxiety Symptoms:  Excessive Worry, Psychotic  Symptoms:  Delusions, Paranoia, PTSD Symptoms: Negative Total Time spent with patient: 1 hour  Past Psychiatric History: No known prior psychiatric history or treatment.  Risk to Self: Is patient at risk for suicide?: Nono Risk to Others:  no Prior Inpatient Therapy:  no Prior Outpatient Therapy:  no  Alcohol Screening: 1. How often do you have a drink containing alcohol?: Never 9. Have  you or someone else been injured as a result of your drinking?: No 10. Has a relative or friend or a doctor or another health worker been concerned about your drinking or suggested you cut down?: No Alcohol Use Disorder Identification Test Final Score (AUDIT): 0 Brief Intervention: AUDIT score less than 7 or less-screening does not suggest unhealthy drinking-brief intervention not indicated Substance Abuse History in the last 12 months:  No. Consequences of Substance Abuse: Negative Previous Psychotropic Medications: No  Psychological Evaluations: No   Past Medical History: Patient reports that she has been seeing Dr.Shaw neurologist here in [place]. He completed an MRI of the brain in March which was negative. She also had an EEG a month ago which was negative. She made an appointment with a neurologist in [facility] for a second opinion. She was a scheduled to have an MRI of the spine and lumbar puncture this week.  Patient suffers from asthma. Denies any history of head trauma, denies any history of seizures.  Family History: History reviewed. No pertinent family history.  Family Psychiatric  History: states that she has an aunt and a cousin that suffer from anxiety. Denies any family history of suicides, drug addiction or alcoholism.  Social History: Patient is married she has a 70-year-old daughter. 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 She lives with her husband and daughter in [place]. History  Alcohol Use  . Yes     History  Drug Use Not on file    Social History   Social History  . Marital Status: Married    Spouse Name: N/A  . Number of Children: N/A  . Years of Education: N/A   Social History Main Topics  . Smoking status: Never Smoker   . Smokeless tobacco: None  . Alcohol Use: Yes  . Drug Use: None  . Sexual Activity: Not Asked   Other Topics Concern  . None   Social History Narrative    Allergies:  No Known Allergies    Lab Results:  Results for orders placed or performed during the hospital encounter of 09/07/15 (from the past 48 hour(s))  Urine Drug Screen, Qualitative ([place] only)     Status: None   Collection Time: 09/07/15  5:19 PM  Result Value Ref Range   Tricyclic, Ur Screen NONE DETECTED NONE DETECTED   Amphetamines, Ur Screen NONE DETECTED NONE DETECTED   MDMA (Ecstasy)Ur Screen NONE DETECTED NONE DETECTED   Cocaine Metabolite,Ur [place] NONE DETECTED NONE DETECTED   Opiate, Ur Screen NONE DETECTED NONE DETECTED   Phencyclidine (PCP) Ur S NONE DETECTED NONE DETECTED   Cannabinoid 50 Ng, Ur [place] NONE DETECTED NONE DETECTED   Barbiturates, Ur Screen NONE DETECTED NONE DETECTED   Benzodiazepine, Ur Scrn NONE DETECTED NONE DETECTED   Methadone Scn, Ur NONE DETECTED NONE DETECTED    Comment: (NOTE) 865  Tricyclics, urine               Cutoff 1000 ng/mL 200  Amphetamines, urine             Cutoff  1000 ng/mL 300  MDMA (Ecstasy), urine           Cutoff 500 ng/mL 400  Cocaine Metabolite, urine       Cutoff 300 ng/mL 500  Opiate, urine                   Cutoff 300 ng/mL 600  Phencyclidine (PCP), urine      Cutoff 25 ng/mL 700  Cannabinoid, urine              Cutoff 50 ng/mL 800  Barbiturates, urine             Cutoff 200 ng/mL 900  Benzodiazepine, urine           Cutoff 200 ng/mL 1000 Methadone, urine                Cutoff 300 ng/mL 1100 1200 The urine drug screen provides only a preliminary, unconfirmed 1300 analytical test result and should not be used for non-medical 1400 purposes. Clinical consideration and professional judgment should 1500 be applied to any positive drug screen result due to possible 1600 interfering substances. A more specific alternate chemical method 1700 must be used in order to obtain a confirmed analytical result.  1800 Gas chromato graphy / mass spectrometry (GC/MS) is the preferred 1900 confirmatory method.   Comprehensive metabolic panel     Status: Abnormal    Collection Time: 09/07/15  5:42 PM  Result Value Ref Range   Sodium 140 135 - 145 mmol/L   Potassium 3.9 3.5 - 5.1 mmol/L   Chloride 106 101 - 111 mmol/L   CO2 28 22 - 32 mmol/L   Glucose, Bld 89 65 - 99 mg/dL   BUN 9 6 - 20 mg/dL   Creatinine, Ser 0.92 0.44 - 1.00 mg/dL   Calcium 9.1 8.9 - 10.3 mg/dL   Total Protein 7.6 6.5 - 8.1 g/dL   Albumin 4.4 3.5 - 5.0 g/dL   AST 13 (L) 15 - 41 U/L   ALT 11 (L) 14 - 54 U/L   Alkaline Phosphatase 42 38 - 126 U/L   Total Bilirubin 0.4 0.3 - 1.2 mg/dL   GFR calc non Af Amer >60 >60 mL/min   GFR calc Af Amer >60 >60 mL/min    Comment: (NOTE) The eGFR has been calculated using the CKD EPI equation. This calculation has not been validated in all clinical situations. eGFR's persistently <60 mL/min signify possible Chronic Kidney Disease.    Anion gap 6 5 - 15  Ethanol (ETOH)     Status: None   Collection Time: 09/07/15  5:42 PM  Result Value Ref Range   Alcohol, Ethyl (B) <5 <5 mg/dL    Comment:        LOWEST DETECTABLE LIMIT FOR SERUM ALCOHOL IS 5 mg/dL FOR MEDICAL PURPOSES ONLY   Salicylate level     Status: None   Collection Time: 09/07/15  5:42 PM  Result Value Ref Range   Salicylate Lvl <1.6 2.8 - 30.0 mg/dL  Acetaminophen level     Status: Abnormal   Collection Time: 09/07/15  5:42 PM  Result Value Ref Range   Acetaminophen (Tylenol), Serum <10 (L) 10 - 30 ug/mL    Comment:        THERAPEUTIC CONCENTRATIONS VARY SIGNIFICANTLY. A RANGE OF 10-30 ug/mL MAY BE AN EFFECTIVE CONCENTRATION FOR MANY PATIENTS. HOWEVER, SOME ARE BEST TREATED AT CONCENTRATIONS OUTSIDE THIS RANGE. ACETAMINOPHEN CONCENTRATIONS >150 ug/mL AT 4 HOURS AFTER INGESTION AND >  50 ug/mL AT 12 HOURS AFTER INGESTION ARE OFTEN ASSOCIATED WITH TOXIC REACTIONS.   CBC     Status: None   Collection Time: 09/07/15  5:42 PM  Result Value Ref Range   WBC 7.2 3.6 - 11.0 K/uL   RBC 4.21 3.80 - 5.20 MIL/uL   Hemoglobin 12.6 12.0 - 16.0 g/dL   HCT 37.6 35.0 - 47.0 %    MCV 89.4 80.0 - 100.0 fL   MCH 29.9 26.0 - 34.0 pg   MCHC 33.5 32.0 - 36.0 g/dL   RDW 13.7 11.5 - 14.5 %   Platelets 251 150 - 440 K/uL  Pregnancy, urine     Status: None   Collection Time: 09/07/15  5:42 PM  Result Value Ref Range   Preg Test, Ur NEGATIVE NEGATIVE    Metabolic Disorder Labs:  No results found for: HGBA1C, MPG No results found for: PROLACTIN No results found for: CHOL, TRIG, HDL, CHOLHDL, VLDL, LDLCALC  Current Medications: Current Facility-Administered Medications  Medication Dose Route Frequency Provider Last Rate Last Dose  . acetaminophen (TYLENOL) tablet 650 mg  650 mg Oral Q6H PRN Gonzella Lex, MD      . alum & mag hydroxide-simeth (MAALOX/MYLANTA) 200-200-20 MG/5ML suspension 30 mL  30 mL Oral Q4H PRN Gonzella Lex, MD      . fluticasone (FLOVENT HFA) 44 MCG/ACT inhaler 2 puff  2 puff Inhalation BID Gonzella Lex, MD   2 puff at 09/08/15 1320  . magnesium hydroxide (MILK OF MAGNESIA) suspension 30 mL  30 mL Oral Daily PRN Gonzella Lex, MD       PTA Medications: Prescriptions prior to admission  Medication Sig Dispense Refill Last Dose  . QVAR 40 MCG/ACT inhaler Inhale 2 puffs into the lungs 2 (two) times daily.   Past Month at Unknown time    Musculoskeletal: Strength & Muscle Tone: within normal limits Gait & Station: normal Patient leans: N/A  Psychiatric Specialty Exam: Physical Exam  Constitutional: She is oriented to person, place, and time. She appears well-developed and well-nourished.  HENT:  Head: Normocephalic and atraumatic.  Eyes: Conjunctivae and EOM are normal.  Neck: Neck supple.  Respiratory: Effort normal.  Musculoskeletal: Normal range of motion.  Neurological: She is alert and oriented to person, place, and time.  Skin: Skin is dry.    Review of Systems  Constitutional: Negative.   Eyes: Negative.   Respiratory: Negative.   Cardiovascular: Negative.   Gastrointestinal: Negative.   Genitourinary: Negative for  dysuria, urgency, frequency, hematuria and flank pain.       Black larva in urine  Musculoskeletal: Negative.   Skin:       Blackouts small larva on scalp  Neurological: Positive for sensory change, focal weakness and headaches.  Psychiatric/Behavioral: Negative.     Blood pressure 119/77, pulse 106, temperature 98.1 F (36.7 C), temperature source Oral, resp. rate 16, height _0  (1.854 m), weight 72.576 kg (160 lb), last menstrual period 08/31/2015, SpO2 100 %.Body mass index is 21.11 kg/(m^2).  General Appearance: Fairly Groomed  Engineer, water::  Fair  Speech:  Pressured  Volume:  Normal  Mood:  Anxious  Affect:  Congruent  Thought Process:  Linear  Orientation:  Full (Time, Place, and Person)  Thought Content:  Delusions  Suicidal Thoughts:  No  Homicidal Thoughts:  No  Memory:  Immediate;   Good Recent;   Good Remote;   Good  Judgement:  Impaired  Insight:  Lacking  Psychomotor Activity:  Normal  Concentration:  Good  Recall:  NA  Fund of Knowledge:Good  Language: Good  Akathisia:  No  Handed:    AIMS (if indicated):     Assets:  Agricultural consultant Housing Social Support Talents/Skills Vocational/Educational  ADL's:  Intact  Cognition: WNL  Sleep:        Treatment Plan Summary: Daily contact with patient to assess and evaluate symptoms and progress in treatment and Medication management   Patient is a 29 year old married Caucasian female who appears to be displaying somatic delusions of parasitosis. Patient has had a multitude of testing that has failed to explain her symptoms.  Patient continues to seek medical treatment and testing in order to find out was causing the headaches and what are the things coming out of her body fluids.  Delusions of parasites psychosis: Patient agrees with a trial of an antipsychotic. She however does not want asked to start looking for possible reasons into her physical symptoms.  I will order  olanzapine size 10 mg by mouth daily at bedtime.  Anxiety: I have ordered Ativan 0.5 mg every 6 hours when necessary  Insomnia: I have ordered Ativan daily at bedtime when necessary for insomnia  Headaches: I have ordered ibuprofen 800 mg every 8 hours as needed  Asthma: Patient will be continued tinea on Pulmicort  Labs: Hemoglobin A1c, lipid panel and TSH have been ordered and now pending. Patient has requested a stool test for parasites which has been ordered.  Diet regular diet  Precautions every 15 minute checks  Vital signs: As patient says sometimes she feels feverish and has been blacking out I will order vital signs to be checked twice a day  Collateral information: Patient has not given me permission to contact her husband  Discharge disposition: Once stable the patient will be discharged back to her home and will continue to follow-up with outpatient psychiatrist.   I certify that inpatient services furnished can reasonably be expected to improve the patient's condition.   Hildred Priest 10/14/20161:55 PM

## 2015-09-08 NOTE — Progress Notes (Signed)
Admission note:  D: patient admitted from the ED BHU.  Patient presents with no SI or HI at this time.  Patient alert.  Patient states that she has parasites in her head that are causing bad pain in the her head.  Patient states that she is having multiple black outs at times.  Patient is denying any auditory or visual hallucinations at this time.  Patient dressed in scrubs and states that her energy level is low.  Patients affect is flat and depressed.  Patient in no distress at this time.   A: skin and belongings searched conducted with no contraband found.  Patient oriented on the unit and with the policies of this unit.  Encouragement and support given to patient, q 15 min checks  R: patient receptive of the information

## 2015-09-08 NOTE — ED Notes (Signed)
Patient transferred to [facility] for inpatient treatment. Cooperative with transfer.

## 2015-09-08 NOTE — Progress Notes (Signed)
Recreation Therapy Notes  INPATIENT RECREATION THERAPY ASSESSMENT  Patient Details Name: Tricia Moran MRN: [id number] DOB: 02-Jan-1986 Today's Date: 09/08/2015  Patient Stressors:  Patient reported no stressors.  Coping Skills:   Exercise, Art/Dance, Talking, Music, Other (Comment) (breathing exercises)  Personal Challenges: Expressing Yourself  Leisure Interests (2+):  Individual - Other (Comment) (Hanging out with family photography)  Awareness of Community Resources:  Yes  Community Resources:  Research scientist (physical sciences)Movie Theaters, [employer]  Current Use: Yes  If no, Barriers?:    Patient Strengths:  Information systems managerutgoing, creative  Patient Identified Areas of Improvement:  Communication and expressing herself  Current Recreation Participation:  Go to the mall, traveling  Patient Goal for Hospitalization:  To feel better  [place]  [place]  County [place] Residence:  [place]   Current SI (including self-harm):  No  Current HI:  No  Consent to Intern Participation: N/A   Tricia Moran,Tricia Moran, LRT/CTRS 09/08/2015, 6:10 PM

## 2015-09-08 NOTE — Tx Team (Signed)
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  The patient and family have been given the opportunity to ask questions and make suggestions.  Crissie ReeseHeather L Mirel Hundal 09/08/2015, 3:26 PM

## 2015-09-08 NOTE — ED Notes (Signed)

## 2015-09-08 NOTE — ED Provider Notes (Signed)
-----------------------------------------   7:19 AM on 09/08/2015 -----------------------------------------   Blood pressure 112/86, pulse 81, temperature 97.8 F (36.6 C), temperature source Oral, resp. rate 16, height 6\' 1"  (1.854 m), weight 160 lb (72.576 kg), last menstrual period 08/31/2015, SpO2 98 %.  The patient had no acute events since last update.  Calm and cooperative at this time.  Disposition is pending per Psychiatry/Behavioral Medicine team recommendations.     Irean HongJade J Eulises Kijowski, MD 09/08/15 [phone]

## 2015-09-08 NOTE — BHH Counselor (Signed)
Writer spoke with patient to access current mental and emotional state. Patient continues to report of having parasite in her brain. She reports of having headaches, pains in different parts of her body. She states, strings of bugs are coming from her nose and her mouth.  She's has seen several different doctors and believes the parasites are in their well water. On yesterday (09/07/2015), she believed she seen a worm in her well water. When [facility] was [facility], on yesterday, her mother and husband expressed concerns to the Psych MD. She didn't know what was said. She gave the verbal permission to talk to the husband Ephriam Knuckles(Christian [phone]) and mother (Diane Thompson-[phone]).  Was unable to reach the husband Ephriam Knuckles(Christian [phone]).  Writer spoke with patient's mother (Diane Thompson-[phone]), and she states she has seen a change in the patient's behavior since March 2016. She has episodes of being paranoid, somatic delusions and increase anxiety. Since has had three episode since March and they worsen with each one. Mother has to come from [place] and help with the care of the daughter (28 years old). Patient has seen multiple neurologists, PCP's. She's visited several ER's with the same complaint. This week she has had over three different ER visits. Each MD has told her, her results were normal. Patient has taking her daughter to several different medical appointments, having test and lab work complete. Each time they have cam back normal. Mother advised the husband to call there pediatrician office and let them know they will need his permission to have any test or labs performed. Her child has missed several days at the daycare because of it.  Patient was recently with her mother, at a family reunion. Since then, she believes the mother is sick. Patient has called the mother job, requesting her co-workers watch her, because of the fear.  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  She has missed work and left early due to her belief of being sick. She denies having any changes in her routine or any stress. Per the report of her mother, her paternal aunt suffers from psychosis and had to be hospitalized for it. She is unsure of the details of it, due to family not talking about it and "trying to keep it a secret."   Spoke with Dr. Toni Amendlapacs about the patient and her being able to be admitted, per Dr. Lucianne MussKumar from the bridge. He stated, he would put admission orders in for the patient.  Pt. [facility] to [facility] by Dr. Toni Amendlapacs. Attending Physician will be Dr. Ardyth HarpsHernandez.  Pt. [facility] 305[facility] [place].  Intake Paper Work has been signed and placed on pt. chart. ER staff [place](Luanne, ER Sect.; Dr. Fanny BienQuale, ER MD; Amy H. Patient's Nurse & Christy Patient Access) have been made aware of the admission.

## 2015-09-08 NOTE — Plan of Care (Signed)
Problem: Ineffective individual coping Goal: LTG: Patient will report a decrease in negative feelings Outcome: Progressing Patient notes her only concern as the periodic potential "blackouts" she is having. She notes this sensation causes her anxiety. Goal: STG: Patient will remain free from self harm Outcome: Progressing No self harm.  Goal: STG:Pt. will utilize relaxation techniques to reduce stress STG: Patient will utilize relaxation techniques to reduce stress levels  Outcome: Progressing Patient interactive in the milieu and pleasant with staff. Participated appropriately with her visitor.

## 2015-09-08 NOTE — BHH Group Notes (Signed)
BHH LCSW Group Therapy  09/08/2015 2:59 PM  Type of Therapy:  Group Therapy  Participation Level:  Did Not Attend  Modes of Intervention:  Discussion, Education, Socialization and Support  Summary of Progress/Problems: Feelings around Relapse. Group members discussed the meaning of relapse and shared personal stories of relapse, how it affected them and others, and how they perceived themselves during this time. Group members were encouraged to identify triggers, warning signs and coping skills used when facing the possibility of relapse. Social supports were discussed and explored in detail.   [name] L Braya Habermehl MSW, LCSWA  09/08/2015, 2:59 P 

## 2015-09-08 NOTE — ED Notes (Signed)
Patient meeting with TTS. She is still anxious but cooperative with all nursing interventions.  Maintained on 15 minute checks and observation by security camera for safety.

## 2015-09-08 NOTE — ED Notes (Signed)
Report given to Herbert SetaHeather, RN on inpatient unit.

## 2015-09-08 NOTE — Progress Notes (Signed)
Recreation Therapy Notes  Date: 10.14.16 Time: 3:00 pm Location: Craft Room  Group Topic: Coping Skills  Goal Area(s) Addresses:  Patient will participate in coping skills. Patient will verbalize benefit of using art as a coping skill.  Behavioral Response: Did not attend  Intervention: Coloring  Activity: Patients were given coloring sheets and instructed to color thinking about what emotions they were feeling and what they were focused on.  Education: LRT educated patients on why art is a good coping skill.   Education Outcome: Patient did not attend group.   Clinical Observations/Feedback: Patient did not attend group.  Para Cossey M, LRT/CTRS 09/08/2015 5:04 PM 

## 2015-09-08 NOTE — ED Notes (Signed)

## 2015-09-08 NOTE — Consult Note (Signed)
  Psychiatry: Chart reviewed. Case reviewed with TTS. Reviewed labs. Patient seems to have worsening psychosis possibly with mood componant as well. Under IVC. Will complete admit orders to allow early admission to unit and more prompt assessment by primary psychiatric team.

## 2015-09-08 NOTE — [facility] Suicide Risk Assessment (Signed)
[facility]   Nursing information obtained from:    Demographic factors:    Current Mental Status:    Loss Factors:    Historical Factors:    Risk Reduction Factors:    Total Time spent with patient: 1 hour Principal Problem: Delusional disorder, somatic type [facility](HCC) Diagnosis:   Patient Active Problem List   Diagnosis Date Noted  . 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  World Science writerHealth Organization [facility](WHO). Score between 0-7:  no or low risk or alcohol related problems. Score between 8-15:  moderate risk of alcohol related problems. Score between 16-19:  high risk of alcohol related problems. Score 20 or above:  warrants further diagnostic evaluation for alcohol dependence and treatment.   CLINICAL FACTORS:   Severe Anxiety and/or Agitation Currently Psychotic    Psychiatric Specialty Exam: Physical Exam  ROS    COGNITIVE FEATURES THAT CONTRIBUTE TO RISK:  None    SUICIDE RISK:   Mild:  Suicidal ideation of limited frequency, intensity, duration, and specificity.  There are no identifiable plans, no associated intent, mild dysphoria and related symptoms, good self-control (both objective and subjective assessment), few other risk factors, and identifiable protective factors, including available and accessible social support.  [facility]  Medical Decision Making:  New problem, with additional work up planned  I certify that inpatient services furnished can reasonably be expected to improve the patient's condition.   Jimmy FootmanHernandez-Gonzalez,  Jonelle Bann 09/08/2015, 2:37 PM

## 2015-09-09 DIAGNOSIS — F22 Delusional disorders: Principal | ICD-10-CM

## 2015-09-09 LAB — HEMOGLOBIN A1C: Hgb A1c MFr Bld: 5 % (ref 4.0–6.0)

## 2015-09-09 MED ORDER — AMITRIPTYLINE HCL 50 MG PO TABS
25.0000 mg | ORAL_TABLET | Freq: Every day | ORAL | Status: DC
  Administered 2015-09-09 – 2015-09-13 (×5): 25 mg via ORAL
  Filled 2015-09-09 (×4): qty 1
  Filled 2015-09-09: qty 0.5

## 2015-09-09 MED ORDER — MELOXICAM 15 MG PO TABS
15.0000 mg | ORAL_TABLET | Freq: Every day | ORAL | Status: DC
  Administered 2015-09-09 – 2015-09-11 (×3): 15 mg via ORAL
  Filled 2015-09-09 (×4): qty 1

## 2015-09-09 MED ORDER — MAGNESIUM OXIDE 400 (241.3 MG) MG PO TABS
400.0000 mg | ORAL_TABLET | Freq: Two times a day (BID) | ORAL | Status: AC
  Administered 2015-09-09 – 2015-09-10 (×2): 400 mg via ORAL
  Filled 2015-09-09 (×2): qty 1

## 2015-09-09 MED ORDER — OLANZAPINE 5 MG PO TBDP
15.0000 mg | ORAL_TABLET | Freq: Every day | ORAL | Status: DC
  Administered 2015-09-09 – 2015-09-10 (×2): 15 mg via ORAL
  Filled 2015-09-09 (×2): qty 3

## 2015-09-09 MED ORDER — RENA-VITE PO TABS
1.0000 | ORAL_TABLET | Freq: Every day | ORAL | Status: AC
  Administered 2015-09-09: 1 via ORAL
  Filled 2015-09-09: qty 1

## 2015-09-09 NOTE — Progress Notes (Signed)
Patient has been seclusive to her room and tearful at times . Continues to complain of severe headaches, numbness on her scalp and left side of her face and also black out episodes. Pt offered medication for anxiety but she refused stating that " that could make me have black outs ". Will continue to observe and maintain a safe environment.

## 2015-09-09 NOTE — BHH Group Notes (Signed)
BHH Group Notes:  (Nursing/MHT/Case Management/Adjunct)  Date:  09/09/2015  Time:  6:25 PM  Type of Therapy:  Group Therapy  Participation Level:  Did Not Attend  Participation Quality:  n/a  Affect:  n/a  Cognitive:  n/a  Insight:  None  Engagement in Group:  None  Modes of Intervention:  n/a  Summary of Progress/Problems:  Jocelyn LamerShakira Deona Montia Haslip 09/09/2015, 6:25 PM

## 2015-09-09 NOTE — Progress Notes (Signed)
[facility] MD Progress Note  09/09/2015 10:45 AM Tricia Moran  MRN:  [id number] Subjective:  Patient lying in bed very tearful. Continues to complain of severe headaches, numbness on her scalp and left side of her face and blacking out episodes (there is no evidence of syncopal episodes per nursing).  Patient is very distressed, anxious and tearful. Says she feels very scared about what's happening to her and the fact that we are not addressing her concerns medically.  She says these "black things" (which patient thinks might be parasites) keep coming out of her nose and she has seen them in the water in her home.  Patient thinks she is content to be anxious and sad as long as she continues to have these symptoms. She denies suicidality, homicidality or having auditory or visual hallucinations.  She states that her husband and her mother came to visit her last night but is feels feels is not a good idea for me to speak with them.  She wants physicians to take a fresh look at her. Patient believes that her husband and her mother are acting differently. She fears that they are also suffering from this type of parasites.   Principal Problem: Delusional disorder, somatic type (HCC) Diagnosis:   Patient Active Problem List   Diagnosis Date Noted  . 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  Risk to Self: Is patient at risk for suicide?: Nono Risk to Others:  no Prior Inpatient Therapy:  no Prior Outpatient Therapy:  no  Alcohol Screening: 1. How often do you have a drink containing alcohol?: Never 9. Have you or someone else been injured as a result of your drinking?: No 10. Has a relative or friend or a doctor or another health worker been concerned about your drinking or suggested you cut down?: No Alcohol Use Disorder Identification Test Final Score (AUDIT): 0 Brief  Intervention: AUDIT score less than 7 or less-screening does not suggest unhealthy drinking-brief intervention not indicated Substance Abuse History in the last 12 months: No. Consequences of Substance Abuse: Negative Previous Psychotropic Medications: No  Psychological Evaluations: No   Past Medical History: Patient reports that she has been seeing Dr.Shaw neurologist here in [place]. He completed an MRI of the brain in March which was negative. She also had an EEG a month ago which was negative. She made an appointment with a neurologist in [facility] for a second opinion. She was a scheduled to have an MRI of the spine and lumbar puncture this week.  Patient suffers from asthma. Denies any history of head trauma, denies any history of seizures.  Family History: History reviewed. No pertinent family history.  Family Psychiatric History: states that she has an aunt and a cousin that suffer from anxiety. Denies any family history of suicides, drug addiction or alcoholism.  Social History: Patient is married she has a 67-year-old daughter. She works as a Education officer, museum at a local high school. She lives with her husband and daughter in [place]. History  Alcohol Use  . Yes    History  Drug Use Not on file    Social History   Social History  . Marital Status: Married    Spouse Name: N/A  . Number of Children: N/A  . Years of Education: N/A   Social History Main Topics  . Smoking status: Never Smoker   .  Smokeless tobacco: None  . Alcohol Use: Yes  . Drug Use: None  . Sexual Activity: Not Asked   Other Topics Concern  . None   Social History Narrative        Sleep: Good  Appetite:  Poor  Current Medications: Current Facility-Administered Medications  Medication Dose Route Frequency Provider Last Rate Last Dose  . acetaminophen (TYLENOL) tablet 650 mg  650 mg Oral Q6H PRN Audery Amel, MD   650  mg at 09/09/15 0915  . alum & mag hydroxide-simeth (MAALOX/MYLANTA) 200-200-20 MG/5ML suspension 30 mL  30 mL Oral Q4H PRN Audery Amel, MD      . amitriptyline (ELAVIL) tablet 25 mg  25 mg Oral QHS Jimmy Footman, MD      . budesonide (PULMICORT) nebulizer solution 0.25 mg  0.25 mg Nebulization BID Jimmy Footman, MD   0.25 mg at 09/09/15 0742  . feeding supplement (ENSURE ENLIVE) (ENSURE ENLIVE) liquid 237 mL  237 mL Oral TID BM Jimmy Footman, MD   237 mL at 09/09/15 0915  . ibuprofen (ADVIL,MOTRIN) tablet 800 mg  800 mg Oral TID PRN Jimmy Footman, MD      . LORazepam (ATIVAN) tablet 0.5 mg  0.5 mg Oral Q6H PRN Jimmy Footman, MD   0.5 mg at 09/08/15 1511  . magnesium hydroxide (MILK OF MAGNESIA) suspension 30 mL  30 mL Oral Daily PRN Audery Amel, MD      . meloxicam (MOBIC) tablet 15 mg  15 mg Oral Daily Jimmy Footman, MD      . OLANZapine zydis (ZYPREXA) disintegrating tablet 15 mg  15 mg Oral QHS Jimmy Footman, MD        Lab Results:  Results for orders placed or performed during the hospital encounter of 09/08/15 (from the past 48 hour(s))  Hemoglobin A1c     Status: None   Collection Time: 09/08/15  1:51 PM  Result Value Ref Range   Hgb A1c MFr Bld 5.0 4.0 - 6.0 %  Lipid panel, fasting     Status: None   Collection Time: 09/08/15  1:51 PM  Result Value Ref Range   Cholesterol 118 0 - 200 mg/dL   Triglycerides 64 <161 mg/dL   HDL 55 >09 mg/dL   Total CHOL/HDL Ratio 2.1 RATIO   VLDL 13 0 - 40 mg/dL   LDL Cholesterol 50 0 - 99 mg/dL    Comment:        Total Cholesterol/HDL:CHD Risk Coronary Heart Disease Risk Table                     Men   Women  1/2 Average Risk   3.4   3.3  Average Risk       5.0   4.4  2 X Average Risk   9.6   7.1  3 X Average Risk  23.4   11.0        Use the calculated Patient Ratio above and the CHD Risk Table to determine the patient's CHD Risk.        ATP III  CLASSIFICATION (LDL):  <100     mg/dL   Optimal  604-540  mg/dL   Near or Above                    Optimal  130-159  mg/dL   Borderline  981-191  mg/dL   High  >478     mg/dL   Very High  TSH     Status: None   Collection Time: 09/08/15  1:51 PM  Result Value Ref Range   TSH 1.591 0.350 - 4.500 uIU/mL    Physical Findings: AIMS: Facial and Oral Movements Muscles of Facial Expression: None, normal Lips and Perioral Area: None, normal Jaw: None, normal Tongue: None, normal,Extremity Movements Upper (arms, wrists, hands, fingers): None, normal Lower (legs, knees, ankles, toes): None, normal, Trunk Movements Neck, shoulders, hips: None, normal, Overall Severity Severity of abnormal movements (highest score from questions above): None, normal Incapacitation due to abnormal movements: None, normal Patient's awareness of abnormal movements (rate only patient's report): No Awareness, Dental Status Current problems with teeth and/or dentures?: No Does patient usually wear dentures?: No  CIWA:  CIWA-Ar Total: 0 COWS:     Musculoskeletal: Strength & Muscle Tone: within normal limits Gait & Station: normal Patient leans: N/A  Psychiatric Specialty Exam: Review of Systems  Constitutional: Negative.   Eyes: Negative.   Respiratory: Negative.   Cardiovascular: Negative.   Gastrointestinal: Negative.   Genitourinary: Negative.   Musculoskeletal: Negative.   Skin: Negative.   Neurological: Positive for sensory change, loss of consciousness and headaches.  Endo/Heme/Allergies: Negative.   Psychiatric/Behavioral: The patient is nervous/anxious.     Blood pressure 130/79, pulse 82, temperature 98.6 F (37 C), temperature source Oral, resp. rate 17, height 6\' 1"  (1.854 m), weight 72.576 kg (160 lb), last menstrual period 08/31/2015, SpO2 98 %.Body mass index is 21.11 kg/(m^2).  General Appearance: Fairly Groomed  Patent attorneyye Contact::  Fair  Speech:  Normal Rate  Volume:  Normal  Mood:   Anxious and Dysphoric  Affect:  Blunt  Thought Process:  Linear  Orientation:  Full (Time, Place, and Person)  Thought Content:  Delusions  Suicidal Thoughts:  No  Homicidal Thoughts:  No  Memory:  Immediate;   Good Recent;   Good Remote;   Good  Judgement:  Impaired  Insight:  Shallow  Psychomotor Activity:  Normal  Concentration:  Good  Recall:  NA  Fund of Knowledge:Good  Language: Good  Akathisia:  No  Handed:    AIMS (if indicated):     Assets:  ArchitectCommunication Skills Financial Resources/Insurance Housing Physical Health Social Support Talents/Skills Vocational/Educational  ADL's:  Intact  Cognition: WNL  Sleep:  Number of Hours: 6   Treatment Plan Summary: Daily contact with patient to assess and evaluate symptoms and progress in treatment and Medication management   Patient is a 29 year old married Caucasian female who appears to be displaying somatic delusions of parasitosis. Patient has had a multitude of testing that has failed to explain her symptoms. Patient continues to seek medical treatment and testing in order to find out was causing the headaches and what are the things coming out of her body fluids.  Delusions of parasites psychosis: Continue olanzapine zydis, dose will be increased from 10 mg to 15 mg daily at bedtime.  Patient keeps insisting on as looking for medical explanations for her symptoms she feels frustrated as she feels were not listening to her and we think is all in her head.  Stool sample was obtained this morning results are pending.  I told the patient was going to discuss the case with neurology.   Anxiety: I have ordered Ativan 0.5 mg every 6 hours when necessary  Insomnia: I have ordered Ativan daily at bedtime when necessary for insomnia  Headaches: Patient continues to complain of severe headaches along with numbness on her skull, face and left side of  her body. I will order Mobic by mouth daily and Elavil 25 mg by mouth daily at  bedtime. I will contact neurology and discussed the case with them.  Asthma: Patient will be continued tinea on Pulmicort  Labs: Hemoglobin A1c, lipid panel and TSH are all within the normal limits. Stool Sample collected on 10/15 for ova- parasites  Diet regular diet  Precautions every 15 minute checks  Vital signs: Continue vitals twice a day.  Collateral information: Patient has not given me permission to contact her husband  Discharge disposition: Once stable the patient will be discharged back to her home and will continue to follow-up with outpatient psychiatrist.  Jimmy Footman 09/09/2015, 10:45 AM

## 2015-09-09 NOTE — Consult Note (Signed)
CC: Headache  HPI: Tricia Moran is an 29 y.o. female 29 y.o. female presenting to the ED under IVC via BPD. Pt brought here for psychiatric activity for delusional thoughts and behaviors.  Pt is complaining of 5 months history of diffuse bilateral headaches that progress to diffuse numbness. They are not relieved with over the counter medications.    When questioned pt's headache changes position/duration and intensity. Numbness changes from L side to R side to whole body.    History reviewed. No pertinent past medical history.  History reviewed. No pertinent past surgical history.  History reviewed. No pertinent family history.  Social History:  reports that she has never smoked. She does not have any smokeless tobacco history on file. She reports that she drinks alcohol. Her drug history is not on file.  No Known Allergies    Physical Examination: Blood pressure 130/79, pulse 82, temperature 98.6 F (37 C), temperature source Oral, resp. rate 17, height 6\' 1"  (1.854 m), weight 72.576 kg (160 lb), last menstrual period 08/31/2015, SpO2 98 %.    Neurological Examination Mental Status: Alert, oriented, thought content appropriate.  Speech fluent without evidence of aphasia.  Able to follow 3 step commands without difficulty. Cranial Nerves: II: Discs flat bilaterally; Visual fields grossly normal, pupils equal, round, reactive to light and accommodation III,IV, VI: ptosis not present, extra-ocular motions intact bilaterally V,VII: smile symmetric, facial light touch sensation normal bilaterally VIII: hearing normal bilaterally IX,X: gag reflex present XI: bilateral shoulder shrug XII: midline tongue extension Motor: Right : Upper extremity   5/5    Left:     Upper extremity   5/5  Lower extremity   5/5     Lower extremity   5/5 Tone and bulk:normal tone throughout; no atrophy noted Sensory: Pinprick and light touch intact throughout, bilaterally Deep Tendon Reflexes: 2+  and symmetric throughout Plantars: Right: downgoing   Left: downgoing Cerebellar: normal finger-to-nose, normal rapid alternating movements and normal heel-to-shin test Gait: normal gait and station      Laboratory Studies:   Basic Metabolic Panel:  Recent Labs Lab 09/06/15 2118 09/07/15 1742  NA 138 140  K 3.5 3.9  CL 104 106  CO2 28 28  GLUCOSE 100* 89  BUN 8 9  CREATININE 0.80 0.92  CALCIUM 9.3 9.1    Liver Function Tests:  Recent Labs Lab 09/06/15 2118 09/07/15 1742  AST 14* 13*  ALT 13* 11*  ALKPHOS 43 42  BILITOT 0.8 0.4  PROT 7.7 7.6  ALBUMIN 4.5 4.4   No results for input(s): LIPASE, AMYLASE in the last 168 hours. No results for input(s): AMMONIA in the last 168 hours.  CBC:  Recent Labs Lab 09/06/15 2118 09/07/15 1742  WBC 6.4 7.2  NEUTROABS 4.3  --   HGB 12.9 12.6  HCT 37.7 37.6  MCV 88.4 89.4  PLT 262 251    Cardiac Enzymes: No results for input(s): CKTOTAL, CKMB, CKMBINDEX, TROPONINI in the last 168 hours.  BNP: Invalid input(s): POCBNP  CBG: No results for input(s): GLUCAP in the last 168 hours.  Microbiology: No results found for this or any previous visit.  Coagulation Studies: No results for input(s): LABPROT, INR in the last 72 hours.  Urinalysis:  Recent Labs Lab 09/06/15 2118  COLORURINE YELLOW*  LABSPEC 1.017  PHURINE 7.0  GLUCOSEU NEGATIVE  HGBUR NEGATIVE  BILIRUBINUR NEGATIVE  KETONESUR NEGATIVE  PROTEINUR NEGATIVE  NITRITE NEGATIVE  LEUKOCYTESUR NEGATIVE    Lipid Panel:  Component Value Date/Time   CHOL 118 09/08/2015 1351   TRIG 64 09/08/2015 1351   HDL 55 09/08/2015 1351   CHOLHDL 2.1 09/08/2015 1351   VLDL 13 09/08/2015 1351   LDLCALC 50 09/08/2015 1351    HgbA1C:  Lab Results  Component Value Date   HGBA1C 5.0 09/08/2015    Urine Drug Screen:     Component Value Date/Time   LABOPIA NONE DETECTED 09/07/2015 1719   LABBENZ NONE DETECTED 09/07/2015 1719   AMPHETMU NONE DETECTED  09/07/2015 1719   THCU NONE DETECTED 09/07/2015 1719   LABBARB NONE DETECTED 09/07/2015 1719    Alcohol Level:  Recent Labs Lab 09/06/15 2118 09/07/15 1742  ETH <5 <5      Imaging: No results found.   Assessment/Plan: HPI: Tricia Moran is an 29 y.o. female 29 y.o. female presenting to the ED under IVC via BPD. Pt brought here for psychiatric activity for delusional thoughts and behaviors.  Pt is complaining of 5 months history of diffuse bilateral headaches that progress to diffuse numbness. They are not relieved with over the counter medications.    When questioned pt's headache changes position/duration and intensity. Numbness changes from L side to R side to whole body.   She is worried about a parasite causing these symptoms.    - Pt was evaluated by multiple Neurologist ([facility] and [facility]).  S/p EEG/MRI which were normal. According to her she states that she was told she needs "Imaging of her spine to look at her spinal fluid".  I believe what she means is LP to look at CSF studies and possible opening pressure - her speech is rushed, symptoms are not consistent. This is not migraine/tension/hemicranium continum/cluster type of headache.  This also does not appear to be due to pseudotumor cerebri with elevated ICP  - If we buy into her symptoms and start imaging such as repeating MRI and LP this will buy into her symptoms.  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   - I have spoken to pharmacy and will attempt to give her Oral magnesium and Vitamin B complex which as per studies have shown to decrease headache severity and duration - Would not give her opiods   09/09/2015, 3:04 PM

## 2015-09-09 NOTE — Progress Notes (Signed)
Patient remained in a state of low-level anxiety the duration of the night. She asked repeatedly if she would be checked on the night and was answered each time that she would be directly observed every 15 minutes once in bed, and continually observed while in the dayroom. She discussed her sharp head pains she has been having, but notes she was not having any at the present time. She states she is happy to be going to see a neurologist, without mention of having already been. She tolerated the visit from her family well. She interacts appropriately and pleasantly in the milieu. She came to the door one time, shut her eyes and jerked her head up and reported that as being one of her black outs. She also called at approximately 1am noting she had awakened to a black out and the sensation of her body feeling heavy. Her vital signs were obtained and were within normal limits. She was observed for a brief period, reassured, and she quickly fell back to sleep. No other needs or distress were noted. She denies SI, HI, and AVH.

## 2015-09-10 MED ORDER — WHITE PETROLATUM GEL
Status: AC
  Filled 2015-09-10: qty 5

## 2015-09-10 NOTE — Progress Notes (Signed)
Pt continues to endorse the thought of having parasites in her body. Pt noted to have some crying spells but not as intent as yesterday. Pt noted to be more active on the unit today. Pt is more redirectable today.

## 2015-09-10 NOTE — Progress Notes (Signed)
[facility]  09/10/2015 8:19 AM Tricia Moran  MRN:  [id number] Subjective:  Patient lying in bed very tearful. Patient was very anxious, agitated and argumentative. Patient is demanding to have a spinal MRI and a lumbar puncture. Patient states were neglecting to take care of her medical complaints and she has thought about suing us and accusing others of malpractice.  Patient states she has been in contact with a friend of hers that is a Clinical research associatelawyer.  Patient continues to report having parasites coming out of her nose at night, having multiples episodes of blacking out and having severe migraines.  Patient spoke with neurologist yesterday but she is not satisfied with the conversation. She says that her family supports the need for further testing. I spoke with the patient's husband this morning who seems to understand well that this appears to be a delusional disorder.   Per nursing: Was in dayroom watching TV at onset of shift. Shortly after receiving report, patient was at nurses station and C/O having "parasites moving about in her head and trying to come through her nose". Reassured that there were no parasites and that she has had numerous tests not showing any abnormalities. Was very reluctant to take PM meds as ordered but did take them with little encouragement. Came to nurses station on a few occasions with somatic complaints. At about at 0145 was seen crawling on floor coming out of her room. When approached she stated "I keep blacking out and my face is tingling." Denied falling, did state that she crawled out of bed. Became hysterical and wanted something to drink. Ambulated to dayroom with nurse and given cup of ice water. Was anxious, hyperventilating, and trembling. VSS per flowsheet. Given PO Ativan and Tylenol for HA. Denied AVH, SI, HI. Also stated that she was spitting up blood in the sink. No blood observed in sink. Will continue to observe for safety.    Principal Problem:  Delusional disorder, somatic type (HCC) Diagnosis:   Patient Active Problem List   Diagnosis Date Noted  . 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  Risk to Self: Is patient at risk for suicide?: Nono Risk to Others:  no Prior Inpatient Therapy:  no Prior Outpatient Therapy:  no  Alcohol Screening: 1. How often do you have a drink containing alcohol?: Never 9. Have you or someone else been injured as a result of your drinking?: No 10. Has a relative or friend or a doctor or another health worker been concerned about your drinking or suggested you cut down?: No Alcohol Use Disorder Identification Test Final Score (AUDIT): 0 Brief Intervention: AUDIT score less than 7 or less-screening does not suggest unhealthy drinking-brief intervention not indicated Substance Abuse History in the last 12 months: No. Consequences of Substance Abuse: Negative Previous Psychotropic Medications: No  Psychological Evaluations: No   Past Medical History: Patient reports that she has been seeing Dr.Shaw neurologist here in [place]. He completed an MRI of the brain in March which was negative. She also had an EEG a month ago which was negative. [place]. She was a scheduled to have an MRI of the spine and lumbar puncture this week.  Patient suffers from asthma. Denies any history of head trauma, denies any history of seizures.  Family History: History reviewed.  No pertinent family history.  Family Psychiatric History: states that she has an aunt and a cousin that suffer from anxiety. Denies any family history of suicides, drug addiction or alcoholism.  Social History: Patient is married she has a 48-year-old daughter. She works as a Education officer, museum at a local high school. She  lives with her husband and daughter in [place]. History  Alcohol Use  . Yes    History  Drug Use Not on file    Social History   Social History  . Marital Status: Married    Spouse Name: N/A  . Number of Children: N/A  . Years of Education: N/A   Social History Main Topics  . Smoking status: Never Smoker   . Smokeless tobacco: None  . Alcohol Use: Yes  . Drug Use: None  . Sexual Activity: Not Asked   Other Topics Concern  . None   Social History Narrative        Sleep: Good  Appetite:  Poor  Current Medications: Current Facility-Administered Medications  Medication Dose Route Frequency Provider Last Rate Last Dose  . acetaminophen (TYLENOL) tablet 650 mg  650 mg Oral Q6H PRN Audery Amel, MD   650 mg at 09/10/15 0157  . alum & mag hydroxide-simeth (MAALOX/MYLANTA) 200-200-20 MG/5ML suspension 30 mL  30 mL Oral Q4H PRN Audery Amel, MD      . amitriptyline (ELAVIL) tablet 25 mg  25 mg Oral QHS Jimmy Footman, MD   25 mg at 09/09/15 2114  . budesonide (PULMICORT) nebulizer solution 0.25 mg  0.25 mg Nebulization BID Jimmy Footman, MD   0.25 mg at 09/10/15 0755  . feeding supplement (ENSURE ENLIVE) (ENSURE ENLIVE) liquid 237 mL  237 mL Oral TID BM Jimmy Footman, MD   237 mL at 09/09/15 1215  . LORazepam (ATIVAN) tablet 0.5 mg  0.5 mg Oral Q6H PRN Jimmy Footman, MD   0.5 mg at 09/10/15 0157  . magnesium hydroxide (MILK OF MAGNESIA) suspension 30 mL  30 mL Oral Daily PRN Audery Amel, MD      . magnesium oxide (MAG-OX) tablet 400 mg  400 mg Oral BID Pauletta Browns, MD   400 mg at 09/09/15 2115  . meloxicam (MOBIC) tablet 15 mg  15 mg Oral Daily Jimmy Footman, MD   15 mg at 09/09/15 1212  . OLANZapine zydis (ZYPREXA) disintegrating tablet 15 mg  15 mg Oral QHS Jimmy Footman, MD   15 mg at 09/09/15 2115    Lab Results:  Results for orders placed or  performed during the hospital encounter of 09/08/15 (from the past 48 hour(s))  Hemoglobin A1c     Status: None   Collection Time: 09/08/15  1:51 PM  Result Value Ref Range   Hgb A1c MFr Bld 5.0 4.0 - 6.0 %  Lipid panel, fasting     Status: None   Collection Time: 09/08/15  1:51 PM  Result Value Ref Range   Cholesterol 118 0 - 200 mg/dL   Triglycerides 64 <161 mg/dL   HDL 55 >09 mg/dL   Total CHOL/HDL Ratio 2.1 RATIO   VLDL 13 0 - 40 mg/dL   LDL Cholesterol 50 0 - 99 mg/dL    Comment:        Total Cholesterol/HDL:CHD Risk Coronary Heart Disease Risk Table                     Men   Women  1/2 Average Risk  3.4   3.3  Average Risk       5.0   4.4  2 X Average Risk   9.6   7.1  3 X Average Risk  23.4   11.0        Use the calculated Patient Ratio above and the CHD Risk Table to determine the patient's CHD Risk.        ATP III CLASSIFICATION (LDL):  <100     mg/dL   Optimal  161-096  mg/dL   Near or Above                    Optimal  130-159  mg/dL   Borderline  045-409  mg/dL   High  >811     mg/dL   Very High   TSH     Status: None   Collection Time: 09/08/15  1:51 PM  Result Value Ref Range   TSH 1.591 0.350 - 4.500 uIU/mL   Aydee, Mcnew [id number] 07/28/2015 11:14:04 AM  DESCRIPTION:  Electroencephalogram of above mentioned patient was acquired with [employer] [employer] equipment. Electrodes were placed according to internationally standardized 10-20 system, after individualized distance measurement. The study was recorded digitally with a bandpass of 1-70 Hz and a sampling rate of at least  and was reviewed with the possibility of multiple reformatting. There was an EKG channel.  The EEG during wakefulness demonstrated 9-10 Hz occipital dominant alpha rhythm that is symmetrically distributed and reactive to eye opening and closure. There were no epileptiform discharges. Hyperventilation did not induce paroxysmal activity. Photic stimulation did not induce seizure activity and  photic driving response was not seen. Movement, electrode and muscle artifact was present during brief part of the record.   Patient entered stage I but stage II sleep was not recorded. No abnormal activation occurred during sleep or at the time of arousal.  IMPRESSION: This is a normal record, without EEG evidence for focality or epileptiform discharges.  CLINICAL CORRELATION: This EEG finding did not support the seizure focus.  MRI Brain 02/02/15 REASON FOR EXAM:    Abnormal MRI Head   Arachnoid Cyst COMMENTS:     PROCEDURE: MR  - MR BRAIN WO/W CONTRAST  - Feb 02 2015 11:40AM   CLINICAL DATA:  Recurrent headaches. Arachnoid cyst. Left-sided tremors. Numbness in the left leg. Aches and pains.  EXAM: MRI HEAD WITHOUT AND WITH CONTRAST  TECHNIQUE: Multiplanar, multiecho pulse sequences of the brain and surrounding structures were obtained without and with intravenous contrast. CONTRAST:  15 mL MultiHance  COMPARISON:  MRI of the head and IAC's 12/25/2012  FINDINGS: No acute infarct, hemorrhage, or mass lesion is present. A retrocerebellar cyst is stable. There is no associated enhancement or significant mass effect.  Flow is present in the major intracranial arteries. The globes and orbits are intact. The right maxillary sinus is near completely opacified. A fluid level is present in the left maxillary sinus. There is diffuse mucosal disease throughout the ethmoid air cells, and bilateral sphenoid sinuses. The right frontal sinus is near completely opacified. The mastoid air cells are clear. The The postcontrast images demonstrate stable appearance of a developmental venous anomaly in the posterior left parietal lobe. There is no associated cavernous malformation.   IMPRESSION: 1. Stable retrocerebellar arachnoid cyst, unlikely to be of clinical consequence to the patient. 2. Stable left parietal developmental venous anomaly. 3. Diffuse sinus disease. This could be related  to the patient's headaches.  Physical Findings: AIMS: Facial and Oral  Movements Muscles of Facial Expression: None, normal Lips and Perioral Area: None, normal Jaw: None, normal Tongue: None, normal,Extremity Movements Upper (arms, wrists, hands, fingers): None, normal Lower (legs, knees, ankles, toes): None, normal, Trunk Movements Neck, shoulders, hips: None, normal, Overall Severity Severity of abnormal movements (highest score from questions above): None, normal Incapacitation due to abnormal movements: None, normal Patient's awareness of abnormal movements (rate only patient's report): No Awareness, Dental Status Current problems with teeth and/or dentures?: No Does patient usually wear dentures?: No  CIWA:  CIWA-Ar Total: 0 COWS:     Musculoskeletal: Strength & Muscle Tone: within normal limits Gait & Station: normal Patient leans: N/A  Psychiatric Specialty Exam: Review of Systems  Constitutional: Negative.   Eyes: Negative.   Respiratory: Negative.   Cardiovascular: Negative.   Gastrointestinal: Negative.   Genitourinary: Negative.   Musculoskeletal: Negative.   Skin: Negative.   Neurological: Positive for sensory change, loss of consciousness and headaches.  Endo/Heme/Allergies: Negative.   Psychiatric/Behavioral: The patient is nervous/anxious.     Blood pressure 120/79, pulse 87, temperature 97.9 F (36.6 C), temperature source Oral, resp. rate 16, height 6\' 1"  (1.854 m), weight 72.576 kg (160 lb), last menstrual period 08/31/2015, SpO2 99 %.Body mass index is 21.11 kg/(m^2).  General Appearance: Fairly Groomed  Patent attorney::  Fair  Speech:  Normal Rate  Volume:  Normal  Mood:  Anxious and Dysphoric  Affect:  Blunt  Thought Process:  Linear  Orientation:  Full (Time, Place, and Person)  Thought Content:  Delusions  Suicidal Thoughts:  No  Homicidal Thoughts:  No  Memory:  Immediate;   Good Recent;   Good Remote;   Good  Judgement:  Impaired  Insight:   Shallow  Psychomotor Activity:  Normal  Concentration:  Good  Recall:  NA  Fund of Knowledge:Good  Language: Good  Akathisia:  No  Handed:    AIMS (if indicated):     Assets:  Architect Housing Physical Health Social Support Talents/Skills Vocational/Educational  ADL's:  Intact  Cognition: WNL  Sleep:  Number of Hours: 3   Treatment Plan Summary: Daily contact with patient to assess and evaluate symptoms and progress in treatment and Medication management   Patient is a 29 year old married Caucasian female who appears to be displaying somatic delusions of parasitosis. Patient has had a multitude of testing that has failed to explain her symptoms. Patient continues to seek medical treatment and testing in order to find out was causing the headaches and what are the things coming out of her body fluids.  Delusions of parasites psychosis: Continue olanzapine zydis 15 mg at bedtime.  Patient keeps insisting on as looking for medical explanations for her symptoms she feels frustrated as she feels were not listening to her and we think is all in her head.  Stool sample was obtained 10/14 results are pending. Not improving, displaying severe symptoms.  Patient is now stating that she will give me a bad review whenever she gets discharged and that she has thought about suing [place] for malpractice.  Anxiety: I have ordered Ativan 0.5 mg every 6 hours when necessary  Insomnia: I have ordered Ativan daily at bedtime when necessary for insomnia  Headaches: Patient continues to complain of severe headaches along with numbness on her skull, face and left side of her body. Pt was started on Mobic by mouth daily and Elavil 25 mg by mouth daily at bedtime. Neurology evaluated pt and reviewed notes and test completed by  her outpt neurologist at the [facility]. Low suspicions for neurological condition.  Agrees with delusional vrs conversion d/o.  Pt given b12  and Mg for headaches.   Asthma: Patient will be continued tinea on Pulmicort  Labs: Hemoglobin A1c, lipid panel and TSH are all within the normal limits. Stool Sample collected on 10/15 for ova- parasites  Diet regular diet  Precautions every 15 minute checks  Vital signs: Continue vitals twice a day.  Collateral information: Patient has not given me permission to contact her husband  Discharge disposition: Once stable the patient will be discharged back to her home and will continue to follow-up with outpatient psychiatrist.  Jimmy Footman 09/10/2015, 8:19 AM

## 2015-09-10 NOTE — [facility] Group Notes (Signed)
[facility] Group Notes:  (Nursing/MHT/Case Management/Adjunct)  Date:  09/10/2015  Time:  9:16 PM  Type of Therapy:  Group Therapy  Participation Level:  Active  Participation Quality:  Appropriate  Affect:  Appropriate  Cognitive:  Appropriate  Insight:  Appropriate  Engagement in Group:  Engaged  Modes of Intervention:  Clarification  Summary of Progress/Problems:  Burt EkJanice Marie Yacoub Diltz 09/10/2015, 9:16 PM

## 2015-09-10 NOTE — [facility] Counselor (Signed)
Adult Comprehensive Assessment  Patient ID: Tricia Moran, female   DOB: 11-27-1985, 29 y.o.   MRN: [id number]  Information Source: Information source: Patient  Current Stressors:  Educational / Learning stressors: None reported  Employment / Job issues: None reported  Family Relationships: Pt reports her husband does not understand what she is going through  Surveyor, quantityinancial / Lack of resources (include bankruptcy): Limited income  Housing / Lack of housing: None reported  Physical health (include injuries & life threatening diseases): Pt reports having black outs, headaches and numbness.  Social relationships: None reported  Substance abuse: None reported  Bereavement / Loss: None reported.   Living/Environment/Situation:  Living Arrangements: Spouse/significant other Living conditions (as described by patient or guardian): husband and daughter  How long has patient lived in current situation?: 1.5 years  What is atmosphere in current home: Supportive, Comfortable, Loving  Family History:  Marital status: Married Number of Years Married: 6 What types of issues is patient dealing with in the relationship?: Pt reports her husband does not understand what she is going through and it causes conflict  Does patient have children?: Yes How many children?: 1 How is patient's relationship with their children?: 28 year old daughter- great relationship   Childhood History:  By whom was/is the patient raised?: Both parents Description of patient's relationship with caregiver when they were a child: "Typical relationship."  Patient's description of current relationship with people who raised him/her: "Typical relationship."  Does patient have siblings?: Yes Number of Siblings: 1 Description of patient's current relationship with siblings: Sister who lives in [place]. Distant relationship.  Did patient suffer any verbal/emotional/physical/sexual abuse as a child?: No Did patient suffer from  severe childhood neglect?: No Has patient ever been sexually abused/assaulted/raped as an adolescent or adult?: No Was the patient ever a victim of a crime or a disaster?: No Witnessed domestic violence?: No Has patient been effected by domestic violence as an adult?: No  Education:  Highest grade of school patient has completed: Automotive engineerCollege  Currently a Consulting civil engineerstudent?: No Learning disability?: No  Employment/Work Situation:   Employment situation: Employed 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  How long has patient been employed?: 3 years  Patient's job has been impacted by current illness: No What is the longest time patient has a held a job?: Current job  Where was the patient employed at that time?: Teacher  Has patient ever been in the Eli Lilly and Companymilitary?: No  Financial Resources:   Financial resources: Income from employment, Income from spouse Does patient have a Lawyerrepresentative payee or guardian?: No  Alcohol/Substance Abuse:   What has been your use of drugs/alcohol within the last 12 months?: Denies use  Alcohol/Substance Abuse Treatment Hx: Denies past history Has alcohol/substance abuse ever caused legal problems?: No  Social Support System:   Conservation officer, natureatient's Community Support System: Good Describe Community Support System: family  Type of faith/religion: Christianity  How does patient's faith help to cope with current illness?: comfort, praying   Leisure/Recreation:   Leisure and Hobbies: photography, traveling, coffee shops, art   Strengths/Needs:   What things does the patient do well?: Photography, mystery shopping.  In what areas does patient struggle / problems for patient: physical health, anxiety   Discharge Plan:   Does patient have access to transportation?: Yes Will patient be returning to same living situation after discharge?: Yes Currently receiving community mental health services: Yes (From Whom) Tricia Moran- psychologist  [place]) Does patient have financial barriers related to discharge  medications?: Yes Patient description of barriers related to discharge medications: limited income   Summary/Recommendations:   Tricia Moran is a 29 year old female who presented to [facility] with somatic delusions. She believes she has a parasite from her trip to [place] in 2013. She reports having blackouts, numbness and pain due to the parasites. Also, she believes there is something wrong with the water in her house because when boiled it left white slimy residue.  She believes this residue is the same substance that comes out of her nose at night. She also sees these symptoms in her husband and 51 year old daughter. She has been to multiple doctors who have not found any thing wrong. She states she has an appointment at a Neurologist in [place] next week. She is connected to a psychologist [place], Tricia Sizer. She does not have a psychiatrist but would like a referral. She plans to return home and follow up with outpatient. Recommendations include; crisis stabilization, medication management, therapeutic milieu, and encourage group attendance and participation.   Tricia Moran MSW, LCSWA  09/10/2015

## 2015-09-10 NOTE — [facility] Group Notes (Signed)
[facility] LCSW Group Therapy  09/10/2015 11:49 AM  Type of Therapy:  Group Therapy  Participation Level:  Active  Participation Quality:  Attentive  Affect:  Appropriate  Cognitive:  Alert  Insight:  Limited  Engagement in Therapy:  Limited  Modes of Intervention:  Discussion, Education, Socialization and Support  Summary of Progress/Problems: Pt will identify unhealthy thoughts and how they impact their emotions and behavior. Pt will be encouraged to discuss these thoughts, emotions and behaviors with the group. Tricia Moran attended group and stayed the entire time. She was very focused on physical symptoms such as black outs.   [place] Yarbro L Christen Wardrop MSW, LCSWA  09/10/2015, 11:49 AM

## 2015-09-10 NOTE — Plan of Care (Signed)
Problem: Ineffective individual coping Goal: LTG: Patient will report a decrease in negative feelings Outcome: Progressing Patient notes she feels a little better, but is still concerned about her physical issues of blacking out and having headaches.  Goal: STG: Pt will be able to identify effective and ineffective STG: Pt will be able to identify effective and ineffective coping patterns  Outcome: Progressing Patient is medication compliant and resting, and notes understanding those are to be helpful.  Goal: STG: Patient will remain free from self harm Outcome: Progressing No self harm.

## 2015-09-10 NOTE — Progress Notes (Signed)
Was in dayroom watching TV at onset of shift. Shortly after receiving report, patient was at nurses station and C/O having "parasites moving about in her head and trying to come through her nose". Reassured that there were no parasites and that she has had numerous tests not showing any abnormalities. Was very reluctant to take PM meds as ordered but did take them with little encouragement. Came to nurses station on a few occasions with somatic complaints. At about at 0145 was seen crawling on floor coming out of her room. When approached she stated "I keep blacking out and my face is tingling." Denied falling, did state that she crawled out of bed. Became hysterical and wanted something to drink. Ambulated to dayroom with nurse and given cup of ice water. Was anxious, hyperventilating, and trembling. VSS per flowsheet. Given PO Ativan and Tylenol for HA. Denied AVH, SI, HI. Also stated that she was spitting up blood in the sink. No blood observed in sink. Will continue to observe for safety.

## 2015-09-11 MED ORDER — OLANZAPINE 5 MG PO TBDP
20.0000 mg | ORAL_TABLET | Freq: Every day | ORAL | Status: DC
  Administered 2015-09-11: 20 mg via ORAL
  Filled 2015-09-11: qty 4

## 2015-09-11 NOTE — [facility] Group Notes (Signed)
[facility] Group Notes:  (Nursing/MHT/Case Management/Adjunct)  Date:  09/11/2015  Time:  9:16 PM  Type of Therapy:  Group Therapy  Participation Level:  Active  Participation Quality:  Appropriate  Affect:  Appropriate  Cognitive:  Appropriate  Insight:  Appropriate  Engagement in Group:  Engaged  Modes of Intervention:  Discussion  Summary of Progress/Problems:  Burt EkJanice Marie Atom Solivan 09/11/2015, 9:16 PM

## 2015-09-11 NOTE — Consult Note (Signed)
CC: Headache  HPI: Esau GrewCourtney Hard is an 29 y.o. female  presenting to the ED under IVC via BPD. Pt brought here for psychiatric activity for delusional thoughts and behaviors.  Pt is complaining of 5 months history of diffuse bilateral headaches that progress to diffuse numbness. They are not relieved with over the counter medications.    When questioned pt's headache changes position/duration and intensity. Numbness changes from L side to R side to whole body.    Today pt's symptoms have changed. She states her headache has improved but complaining of of numbness on R side of face that changes with sudden head positions. Also complaining of back pressure/numbness.     History reviewed. No pertinent past medical history.  History reviewed. No pertinent past surgical history.  History reviewed. No pertinent family history.  Social History:  reports that she has never smoked. She does not have any smokeless tobacco history on file. She reports that she drinks alcohol. Her drug history is not on file.  No Known Allergies    Physical Examination: Blood pressure 115/77, pulse 87, temperature 97.9 F (36.6 C), temperature source Oral, resp. rate 16, height 6\' 1"  (1.854 m), weight 72.576 kg (160 lb), last menstrual period 08/31/2015, SpO2 98 %.    Neurological Examination Mental Status: Alert, oriented, thought content appropriate.  Speech fluent without evidence of aphasia.  Able to follow 3 step commands without difficulty. Cranial Nerves: II: Discs flat bilaterally; Visual fields grossly normal, pupils equal, round, reactive to light and accommodation III,IV, VI: ptosis not present, extra-ocular motions intact bilaterally V,VII: smile symmetric, facial light touch sensation normal bilaterally VIII: hearing normal bilaterally IX,X: gag reflex present XI: bilateral shoulder shrug XII: midline tongue extension Motor: Right : Upper extremity   5/5    Left:     Upper extremity    5/5  Lower extremity   5/5     Lower extremity   5/5 Tone and bulk:normal tone throughout; no atrophy noted Sensory: Pinprick and light touch intact throughout, bilaterally Deep Tendon Reflexes: 2+ and symmetric throughout Plantars: Right: downgoing   Left: downgoing Cerebellar: normal finger-to-nose, normal rapid alternating movements and normal heel-to-shin test Gait: normal gait and station      Laboratory Studies:   Basic Metabolic Panel:  Recent Labs Lab 09/06/15 2118 09/07/15 1742  NA 138 140  K 3.5 3.9  CL 104 106  CO2 28 28  GLUCOSE 100* 89  BUN 8 9  CREATININE 0.80 0.92  CALCIUM 9.3 9.1    Liver Function Tests:  Recent Labs Lab 09/06/15 2118 09/07/15 1742  AST 14* 13*  ALT 13* 11*  ALKPHOS 43 42  BILITOT 0.8 0.4  PROT 7.7 7.6  ALBUMIN 4.5 4.4   No results for input(s): LIPASE, AMYLASE in the last 168 hours. No results for input(s): AMMONIA in the last 168 hours.  CBC:  Recent Labs Lab 09/06/15 2118 09/07/15 1742  WBC 6.4 7.2  NEUTROABS 4.3  --   HGB 12.9 12.6  HCT 37.7 37.6  MCV 88.4 89.4  PLT 262 251    Cardiac Enzymes: No results for input(s): CKTOTAL, CKMB, CKMBINDEX, TROPONINI in the last 168 hours.  BNP: Invalid input(s): POCBNP  CBG: No results for input(s): GLUCAP in the last 168 hours.  Microbiology: No results found for this or any previous visit.  Coagulation Studies: No results for input(s): LABPROT, INR in the last 72 hours.  Urinalysis:   Recent Labs Lab 09/06/15 2118  COLORURINE YELLOW*  LABSPEC 1.017  PHURINE 7.0  GLUCOSEU NEGATIVE  HGBUR NEGATIVE  BILIRUBINUR NEGATIVE  KETONESUR NEGATIVE  PROTEINUR NEGATIVE  NITRITE NEGATIVE  LEUKOCYTESUR NEGATIVE    Lipid Panel:     Component Value Date/Time   CHOL 118 09/08/2015 1351   TRIG 64 09/08/2015 1351   HDL 55 09/08/2015 1351   CHOLHDL 2.1 09/08/2015 1351   VLDL 13 09/08/2015 1351   LDLCALC 50 09/08/2015 1351    HgbA1C:  Lab Results  Component  Value Date   HGBA1C 5.0 09/08/2015    Urine Drug Screen:      Component Value Date/Time   LABOPIA NONE DETECTED 09/07/2015 1719   LABBENZ NONE DETECTED 09/07/2015 1719   AMPHETMU NONE DETECTED 09/07/2015 1719   THCU NONE DETECTED 09/07/2015 1719   LABBARB NONE DETECTED 09/07/2015 1719    Alcohol Level:   Recent Labs Lab 09/06/15 2118 09/07/15 1742  ETH <5 <5      Imaging: No results found.   Assessment/Plan: HPI: Viviann Broyles is an 29 y.o. female 29 y.o. female presenting to the ED under IVC via BPD. Pt brought here for psychiatric activity for delusional thoughts and behaviors.  Pt is complaining of 5 months history of diffuse bilateral headaches that progress to diffuse numbness. They are not relieved with over the counter medications.    When questioned pt's headache changes position/duration and intensity. Numbness changes from L side to R side to whole body.   She is worried about a parasite causing these symptoms.    Today pt's symptoms have changed. She states her headache has improved but complaining of of numbness on R side of face that changes with sudden head positions. Also complaining of back pressure/numbness.     Problem stems from her last PCP meeting stating that she needs various imaging which she bought in.  She has seen numerous neurologists as well as different systems that included EEG and MRI studies.    I do not want to want to buy in her symptoms as we can do endless number of testing which will not be beneficial to her.    Since her symptoms as she states are a lot of worse for 1-2 weeks will perform MRI of brain but would not perform any more testing.   Pauletta Browns   09/11/2015, 3:06 PM

## 2015-09-11 NOTE — Plan of Care (Signed)
Problem: Alteration in mood; excessive anxiety as evidenced by: Goal: LTG-Patient's behavior demonstrates decreased anxiety (Patient's behavior demonstrates anxiety and he/she is utilizing learned coping skills to deal with anxiety-producing situations)  Outcome: Not Met (add Reason) Pt remains anxious. Present with multiple somatic complaints states she parasites in her blood. Med compliant. No voiced thoughts of hurting herself. q 15 min checks maintained for safety.

## 2015-09-11 NOTE — [facility] Group Notes (Signed)
[facility] Group Notes:  (Nursing/MHT/Case Management/Adjunct)  Date:  09/11/2015  Time:  2:04 PM  Type of Therapy:  Psychoeducational Skills  Participation Level:  Active  Participation Quality:  Appropriate  Affect:  Appropriate  Cognitive:  Appropriate  Insight:  Appropriate  Engagement in Group:  Engaged  Modes of Intervention:  Discussion, Education and Support  Summary of Progress/Problems:  Darrow BussingMarly S Lashawnda Hancox 09/11/2015, 2:04 PM

## 2015-09-11 NOTE — [facility] Group Notes (Signed)
[facility]   09/11/2015 4:02 PM  Participation Quality:  Did not attend group   Lulu RidingIngle, Hargis Vandyne T, MSW, LCSWA

## 2015-09-11 NOTE — Progress Notes (Signed)
Patient cooperative and compliant. Reports she feels better but also that she continues to have concerns over her physical health. Pt was able to sleep in the early evening and notes she has not slept that well in a long time. She did wake once in the night and noted she was scared, but was comforted knowing someone would check on her. She said she felt cold and like she was spiraling down a hole, and was worried her skin was cold, however she was warm to the touch and her coloring was good. She noted a feeling of comfort when told she looked well. No other needs or distress, med compliant, and pleasant with her peers. Denies SI, HI, and AVH.

## 2015-09-11 NOTE — Progress Notes (Signed)
[facility] MD Progress Note  09/11/2015 9:31 AM Tricia Moran  MRN:  [id number] Subjective:  Patient had a very difficult weekend. She was severely distressed, having frequent crying spells, was reporting multiple somatic symptoms to the staff. Frequently at the nurse's station reporting numbness, weakness, headaches, blacking out, speeding up a lot.  At all times her vital signs were within the normal limits. There was no objective evidence to support the patient's complaints.  Patient's family was contacted yesterday they're very concerned about the patient's book patient with somatic symptoms. They stated that they have a multitude of medical bills. Even when they advised her not to go to doctors appointments as they feel she is okay the patient constantly is seeking testing and has been in to several emergency rooms. She even switched primary care doctors. Patient has given taking her daughter for doctor's appointments as she thinks her daughter also is affected by this parasite infestation.   Patient is not fixated on getting an MRI of the spine and a lumbar puncture. None of these procedures has been recommended by the neurologist that was consulted over the weekend.  Patient lying in bed very tearful. Patient was very anxious and tearful. Patient tells me she has not been attending groups because she is having great difficulties walking. Just a few minutes earlier before our conversation I saw her walking from her room to the day room where she was getting a Gatorade.  Still demanding more testing.    Denies SI, HI or having auditory or visual hallucinations. Does report poor energy, poor appetite, anxiety. Denies issues with sleep. Denies side effects from medications. As far as physical complaints continues to report headaches, difficulties walking, bloody sputum, blacking out.    Per nursing: Patient cooperative and compliant. Reports she feels better but also that she continues to have concerns over  her physical health. Pt was able to sleep in the early evening and notes she has not slept that well in a long time. She did wake once in the night and noted she was scared, but was comforted knowing someone would check on her. She said she felt cold and like she was spiraling down a hole, and was worried her skin was cold, however she was warm to the touch and her coloring was good. She noted a feeling of comfort when told she looked well. No other needs or distress, med compliant, and pleasant with her peers. Denies SI, HI, and AVH.   Principal Problem: Delusional disorder, somatic type (HCC) Diagnosis:   Patient Active Problem List   Diagnosis Date Noted  . 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  Risk to Self: Is patient at risk for suicide?: Nono Risk to Others:  no Prior Inpatient Therapy:  no Prior Outpatient Therapy:  no  Alcohol Screening: 1. How often do you have a drink containing alcohol?: Never 9. Have you or someone else been injured as a result of your drinking?: No 10. Has a relative or friend or a doctor or another health worker been concerned about your drinking or suggested you cut down?: No Alcohol Use Disorder Identification Test Final Score (AUDIT): 0 Brief Intervention: AUDIT score less than 7 or less-screening does not suggest unhealthy drinking-brief intervention not indicated Substance Abuse History in the last 12 months: No. Consequences of Substance Abuse: Negative Previous Psychotropic Medications: No  Psychological Evaluations: No  Past Medical History: Patient reports that she has been seeing Dr.Shaw neurologist here in [place]. He completed an MRI of the brain in March which was negative. She also had an EEG a month ago which was negative. She made an appointment with a neurologist in [facility] for a  second opinion. She was a scheduled to have an MRI of the spine and lumbar puncture this week.  Patient suffers from asthma. Denies any history of head trauma, denies any history of seizures.  Family History: History reviewed. No pertinent family history.  Family Psychiatric History: states that she has an aunt and a cousin that suffer from anxiety. Denies any family history of suicides, drug addiction or alcoholism.  Social History: Patient is married she has a 7-year-old daughter. She works as a Education officer, museum at a local high school. She lives with her husband and daughter in [place]. History  Alcohol Use  . Yes    History  Drug Use Not on file    Social History   Social History  . Marital Status: Married    Spouse Name: N/A  . Number of Children: N/A  . Years of Education: N/A   Social History Main Topics  . Smoking status: Never Smoker   . Smokeless tobacco: None  . Alcohol Use: Yes  . Drug Use: None  . Sexual Activity: Not Asked   Other Topics Concern  . None   Social History Narrative        Sleep: Good  Appetite:  Poor  Current Medications: Current Facility-Administered Medications  Medication Dose Route Frequency Provider Last Rate Last Dose  . acetaminophen (TYLENOL) tablet 650 mg  650 mg Oral Q6H PRN Audery Amel, MD   650 mg at 09/11/15 0919  . alum & mag hydroxide-simeth (MAALOX/MYLANTA) 200-200-20 MG/5ML suspension 30 mL  30 mL Oral Q4H PRN Audery Amel, MD      . amitriptyline (ELAVIL) tablet 25 mg  25 mg Oral QHS Jimmy Footman, MD   25 mg at 09/10/15 2300  . budesonide (PULMICORT) nebulizer solution 0.25 mg  0.25 mg Nebulization BID Jimmy Footman, MD   0.25 mg at 09/11/15 0850  . feeding supplement (ENSURE ENLIVE) (ENSURE ENLIVE) liquid 237 mL  237 mL Oral TID BM Jimmy Footman, MD   237 mL at 09/10/15 1629  . LORazepam (ATIVAN) tablet 0.5 mg  0.5 mg Oral  Q6H PRN Jimmy Footman, MD   0.5 mg at 09/10/15 0157  . magnesium hydroxide (MILK OF MAGNESIA) suspension 30 mL  30 mL Oral Daily PRN Audery Amel, MD      . meloxicam (MOBIC) tablet 15 mg  15 mg Oral Daily Jimmy Footman, MD   15 mg at 09/11/15 0920  . OLANZapine zydis (ZYPREXA) disintegrating tablet 15 mg  15 mg Oral QHS Jimmy Footman, MD   15 mg at 09/10/15 2300    Lab Results:  No results found for this or any previous visit (from the past 48 hour(s)). Tricia Moran, Tricia Moran 07/28/2015 11:14:04 AM  DESCRIPTION:  Electroencephalogram of above mentioned patient was acquired with [employer] [employer] equipment. Electrodes were placed according to internationally standardized 10-20 system, after individualized distance measurement. The study was recorded digitally with a bandpass of 1-70 Hz and a sampling rate of at least  and was reviewed with the possibility of multiple reformatting. There was an EKG channel.  The EEG during wakefulness demonstrated 9-10 Hz occipital dominant alpha rhythm that is symmetrically distributed and reactive to  eye opening and closure. There were no epileptiform discharges. Hyperventilation did not induce paroxysmal activity. Photic stimulation did not induce seizure activity and photic driving response was not seen. Movement, electrode and muscle artifact was present during brief part of the record.   Patient entered stage I but stage II sleep was not recorded. No abnormal activation occurred during sleep or at the time of arousal.  IMPRESSION: This is a normal record, without EEG evidence for focality or epileptiform discharges.  CLINICAL CORRELATION: This EEG finding did not support the seizure focus.  MRI Brain 02/02/15 REASON FOR EXAM:    Abnormal MRI Head   Arachnoid Cyst COMMENTS:     PROCEDURE: MR  - MR BRAIN WO/W CONTRAST  - Feb 02 2015 11:40AM   CLINICAL DATA:  Recurrent headaches. Arachnoid cyst. Left-sided tremors.  Numbness in the left leg. Aches and pains.  EXAM: MRI HEAD WITHOUT AND WITH CONTRAST  TECHNIQUE: Multiplanar, multiecho pulse sequences of the brain and surrounding structures were obtained without and with intravenous contrast. CONTRAST:  15 mL MultiHance  COMPARISON:  MRI of the head and IAC's 12/25/2012  FINDINGS: No acute infarct, hemorrhage, or mass lesion is present. A retrocerebellar cyst is stable. There is no associated enhancement or significant mass effect.  Flow is present in the major intracranial arteries. The globes and orbits are intact. The right maxillary sinus is near completely opacified. A fluid level is present in the left maxillary sinus. There is diffuse mucosal disease throughout the ethmoid air cells, and bilateral sphenoid sinuses. The right frontal sinus is near completely opacified. The mastoid air cells are clear. The The postcontrast images demonstrate stable appearance of a developmental venous anomaly in the posterior left parietal lobe. There is no associated cavernous malformation.   IMPRESSION: 1. Stable retrocerebellar arachnoid cyst, unlikely to be of clinical consequence to the patient. 2. Stable left parietal developmental venous anomaly. 3. Diffuse sinus disease. This could be related to the patient's headaches.  Physical Findings: AIMS: Facial and Oral Movements Muscles of Facial Expression: None, normal Lips and Perioral Area: None, normal Jaw: None, normal Tongue: None, normal,Extremity Movements Upper (arms, wrists, hands, fingers): None, normal Lower (legs, knees, ankles, toes): None, normal, Trunk Movements Neck, shoulders, hips: None, normal, Overall Severity Severity of abnormal movements (highest score from questions above): None, normal Incapacitation due to abnormal movements: None, normal Patient's awareness of abnormal movements (rate only patient's report): No Awareness, Dental Status Current problems with teeth  and/or dentures?: No Does patient usually wear dentures?: No  CIWA:  CIWA-Ar Total: 0 COWS:     Musculoskeletal: Strength & Muscle Tone: within normal limits Gait & Station: normal Patient leans: N/A  Psychiatric Specialty Exam: Review of Systems  Constitutional: Negative.   Eyes: Negative.   Respiratory: Negative.   Cardiovascular: Negative.   Gastrointestinal: Negative.   Genitourinary: Negative.   Musculoskeletal: Negative.   Skin: Negative.   Neurological: Positive for sensory change, loss of consciousness and headaches.  Endo/Heme/Allergies: Negative.   Psychiatric/Behavioral: The patient is nervous/anxious.     Blood pressure 115/77, pulse 87, temperature 97.9 F (36.6 C), temperature source Oral, resp. rate 16, height 6\' 1"  (1.854 m), weight 72.576 kg (160 lb), last menstrual period 08/31/2015, SpO2 98 %.Body mass index is 21.11 kg/(m^2).  General Appearance: Fairly Groomed  Patent attorney::  Fair  Speech:  Normal Rate  Volume:  Normal  Mood:  Anxious and Dysphoric  Affect:  Blunt  Thought Process:  Linear  Orientation:  Full (Time,  Place, and Person)  Thought Content:  Delusions  Suicidal Thoughts:  No  Homicidal Thoughts:  No  Memory:  Immediate;   Good Recent;   Good Remote;   Good  Judgement:  Impaired  Insight:  Shallow  Psychomotor Activity:  Normal  Concentration:  Good  Recall:  NA  Fund of Knowledge:Good  Language: Good  Akathisia:  No  Handed:    AIMS (if indicated):     Assets:  SolicitorCommunication Skills Financial Resources/Insurance Housing Physical Health Social Support Talents/Skills Vocational/Educational  ADL's:  Intact  Cognition: WNL  Sleep:  Number of Hours: 6.5   Treatment Plan Summary: Daily contact with patient to assess and evaluate symptoms and progress in treatment and Medication management   Patient is a 29 year old married Caucasian female who appears to be displaying somatic delusions of parasitosis. Patient has had a  multitude of testing that has failed to explain her symptoms. Patient continues to seek medical treatment and testing in order to find out was causing the headaches and what are the things coming out of her body fluids.  Delusions of parasites psychosis: Continue olanzapine zydis will increase to 20 mg qhs. Patient keeps insisting on as looking for medical explanations for her symptoms she feels frustrated as she feels were not listening to her and we think is all in her head.  Stool sample was obtained 10/14 results are pending. No much change since admission.  Anxiety: I have ordered Ativan 0.5 mg every 6 hours when necessary  Insomnia: I have ordered Ativan daily at bedtime when necessary for insomnia  Headaches: Patient continues to complain of severe headaches along with numbness on her skull, face and left side of her body. Pt was started on Mobic by mouth daily and Elavil 25 mg by mouth daily at bedtime. Neurology evaluated pt and reviewed notes and test completed [facility]. Low suspicions for neurological condition.  Agrees with delusional vrs conversion d/o.  Pt given b12 and Mg for headaches. Pt reports no improvement.  Still demanding more testing.    Asthma: Patient will be continued tinea on Pulmicort  Labs: Hemoglobin A1c, lipid panel and TSH are all within the normal limits. Stool Sample collected on 10/15 for ova- parasites  Diet regular diet  Precautions every 15 minute checks  Vital signs: Continue vitals twice a day.  Collateral information: Patient's husband and mother were updated yesterday. They both agreed that this is likely to be psychiatric.  Discharge disposition: Once stable the patient will be discharged back to her home and will continue to follow-up with outpatient psychiatrist.  Jimmy FootmanHernandez-Gonzalez,  Tricia Moran 09/11/2015, 9:31 AM

## 2015-09-11 NOTE — Plan of Care (Signed)
Problem: [facility] Participation in Recreation Therapeutic Interventions Goal: STG-Other Recreation Therapy Goal (Specify) STG: Self-expression - Within 6 treatment sessions, patient will participate in self-expression activity in each of 4 treatment sessions to increase self-expression post d/c.  Outcome: Progressing Treatment Session 1; Completed 1 out of 4: At approximately 12:50 pm, LRT met with patient in patient room. LRT explained self-expression activity. Patient participated in self-expression activity. Patient verbalized benefit.   M , LRT/CTRS 10.17.16 4:45 pm     

## 2015-09-11 NOTE — Progress Notes (Signed)
Patient has had a labile mood today. She is frequently tearful but stops crying shortly after she starts. She remains focused on somatic symptoms. After seeing neurologist today, she was crying and saying that she needed more tests done to find out what is wrong with her. She said she is starting to have a metallic taste in her mouth. Has attended some groups and been out of her room much of the day.

## 2015-09-11 NOTE — Progress Notes (Signed)
Recreation Therapy Notes  Date: 10.17.16 Time: 3:00 pm Location: Craft Room  Group Topic: Self-expression  Goal Area(s) Addresses:  Patient will be able to identify a color that represents each emotion. Patient will verbalize benefit of using art as a means of self-expression. Patient will verbalize one positive emotion experienced while participating in activity.  Behavioral Response: Arrived late, Attentive  Intervention: The Colors Within Me  Activity: Patients were given blank face worksheets and instructed to pick a color for each emotion they were experiencing and show how much of the emotion they were experiencing on the face.  Education: LRT educated patients on different forms of self-expression.  Education Outcome: In group clarification offered   Clinical Observations/Feedback: Patient arrived to group at approximately 3:30 pm. Patient completed activity by drawing a face. Patient did not contribute to group discussion. Patient grabbed the table and her chest as she started to breathe heavier. Patient stated, "I might have to go back to my room." Patient continued coloring and stayed until group was over.  Jacquelynn CreeGreene,Shandon Burlingame M, LRT/CTRS 09/11/2015 4:27 PM

## 2015-09-12 ENCOUNTER — Inpatient Hospital Stay: Payer: BC Managed Care – PPO

## 2015-09-12 MED ORDER — OLANZAPINE 10 MG PO TABS
20.0000 mg | ORAL_TABLET | Freq: Every day | ORAL | Status: DC
  Administered 2015-09-12 – 2015-09-14 (×3): 20 mg via ORAL
  Filled 2015-09-12 (×4): qty 2

## 2015-09-12 MED ORDER — LORAZEPAM 2 MG/ML IJ SOLN
2.0000 mg | Freq: Once | INTRAMUSCULAR | Status: AC
  Administered 2015-09-13: 2 mg via INTRAMUSCULAR
  Filled 2015-09-12: qty 1

## 2015-09-12 NOTE — [facility] Group Notes (Signed)
[facility] LCSW Group Therapy  09/12/2015 2:56 PM  Type of Therapy:  Group Therapy  Participation Level:  Minimal  Participation Quality:  Appropriate and Attentive  Affect:  Anxious  Cognitive:  Alert and Appropriate  Insight:  Improving  Engagement in Therapy:  Improving  Modes of Intervention:  Socialization and Support  Summary of Progress/Problems:Patient attended group and participated in introduction exercise but left early after 10 minutes of group and did not return. Patient introduced herself and shared during an introduction exercise that her "superpower' would be "to be in 2 places at once so I could get the help I need here and be at home taking care of my family". Patient left group and did not return after sharing.   Lulu RidingIngle, Pascale Maves T, MSW, LCSWA 09/12/2015, 2:56 PM

## 2015-09-12 NOTE — Tx Team (Signed)
Interdisciplinary Treatment Plan Update (Adult)  Date:  09/12/2015 Time Reviewed:  3:22 PM  Progress in Treatment: Attending groups: Yes. Participating in groups:  No. Taking medication as prescribed:  No. Tolerating medication:  Yes. Family/Significant othe contact made:  No, will contact:  if patient provides consent Patient understands diagnosis:  No. Discussing patient identified problems/goals with staff:  Yes. Medical problems stabilized or resolved:  Yes. Denies suicidal/homicidal ideation: Yes. Issues/concerns per patient self-inventory:  No. Other:  New problem(s) identified: No, Describe:  none reported  Discharge Plan or Barriers: Patient will discharge home with her husband once stabilized on medications and will need mental health follow up with a psychiatrist.   Reason for Continuation of Hospitalization: Delusions   Comments:  Estimated length of stay: up to 5 days  New goal(s):  Review of initial/current patient goals per problem list:   1.  Goal(s):partiipate in care plan  Met:  Yes   2.  Goal (s):eliminate delusions  Met:  No  Target date: 09/18/15  Attendees: Physician:  Merlyn Albert, MD 10/18/20163:22 PM  Nursing:   Tami Lin, RN 10/18/20163:22 PM  Other:  Carmell Austria, [place] 10/18/20163:22 PM  Other:   10/18/20163:22 PM  Other:   10/18/20163:22 PM  Other:  10/18/20163:22 PM  Other:  10/18/20163:22 PM  Other:  10/18/20163:22 PM  Other:  10/18/20163:22 PM  Other:  10/18/20163:22 PM  Other:  10/18/20163:22 PM  Other:   10/18/20163:22 PM   Scribe for Treatment Team:   Keene Breath, MSW, [place]  09/12/2015, 3:22 PM

## 2015-09-12 NOTE — Progress Notes (Signed)
D: Patient stated slept good last night .Stated appetite is fair and energy level  Is normal. Stated concentration is good . Stated on Depression scale 2 , hopeless 2 and anxiety  2.( low 0-10 high) Denies suicidal  homicidal ideations  .  No auditory hallucinations  No pain concerns . Appropriate ADL'S. Interacting with peers and staff.  Somatic complaints with her head physical pain  Dizziness ,but refused to take medication .  Patient went to MRI unable to stay for the procedure. Continue to have complaints  A: Encourage patient participation with unit programming . Instruction  Given on  Medication , verbalize understanding. R: Voice no other concerns. Staff continue to monitor

## 2015-09-12 NOTE — Plan of Care (Signed)
Problem: Ineffective individual coping Goal: LTG: Patient will report a decrease in negative feelings Outcome: Progressing Denies suicidal ideations      

## 2015-09-12 NOTE — [facility] Group Notes (Signed)
[facility] Group Notes:  (Nursing/MHT/Case Management/Adjunct)  Date:  09/12/2015  Time:  1:57 PM  Type of Therapy:  Psychoeducational Skills  Participation Level:  Active  Participation Quality:  Appropriate and Sharing  Affect:  Appropriate  Cognitive:  Appropriate and Oriented  Insight:  Appropriate  Engagement in Group:  Developing/Improving and Engaged  Modes of Intervention:  Discussion and Education  Summary of Progress/Problems:  Tricia Moran 09/12/2015, 1:57 PM

## 2015-09-12 NOTE — Progress Notes (Signed)
[facility] MD Progress Note  09/12/2015 3:29 PM Nafisah Runions  MRN:  [id number] Subjective:   Patient lying in bed but not tearful or overly anxious today.  Neurologist order an MRI of the brain for which the patient was taken to radiology. She told the radiologist that she felt claustrophobic. The radiologist attempted to complete the MRI 3 times but he had to be canceled due to the patient's anxiety.  Denies SI, HI or having auditory or visual hallucinations. Patient denies insomnia. Stay she has been using a little better. She still has problems walking and is still has to hold from the rails.  She reports that the headaches are not as severe. Today is the first day where I have been able to speak with her without having the patient crying.  She was much calmer and more positive.  She was seen later on attending groups.  She does appear suspicious and paranoid at times she came towards the nurse station and was asking me about the test results from the MRI.  I explained to her that there were no results from the MRI but the patient did not seem convinced. She was worried about having a family meeting as she told me she has never seen any of the patient's having family meetings and she was thinking that maybe we had bad results  to reveal to them.  I assured her that this was not the case and that family meetings were done here for tingling again patient did not seem satisfied with that as. She'll look at the nurses station and so her nurse looking at a computer screen and she wanted to know what was the nurse looking at.   Per nursing: D: Patient stated slept good last night .Stated appetite is fair and energy level Is normal. Stated concentration is good . Stated on Depression scale 2 , hopeless 2 and anxiety 2.( low 0-10 high) Denies suicidal homicidal ideations . No auditory hallucinations No pain concerns . Appropriate ADL'S. Interacting with peers and staff. Somatic complaints with her head  physical pain Dizziness ,but refused to take medication . Patient went to MRI unable to stay for the procedure. Continue to have complaints  A: Encourage patient participation with unit programming . Instruction Given on Medication , verbalize understanding. R: Voice no other concerns. Staff continue to monitor   Principal Problem: Delusional disorder, somatic type [facility]) Diagnosis:   Patient Active Problem List   Diagnosis Date Noted  . 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  Risk to Self: Is patient at risk for suicide?: Nono Risk to Others:  no Prior Inpatient Therapy:  no Prior Outpatient Therapy:  no  Alcohol Screening: 1. How often do you have a drink containing alcohol?: Never 9. Have you or someone else been injured as a result of your drinking?: No 10. Has a relative or friend or a doctor or another health worker been concerned about your drinking or suggested you cut down?: No Alcohol Use Disorder Identification Test Final Score (AUDIT): 0 Brief Intervention: AUDIT score less than 7 or less-screening does not suggest unhealthy drinking-brief intervention not indicated Substance Abuse History in the last 12 months: No. Consequences of Substance Abuse: Negative Previous Psychotropic Medications: No  Psychological Evaluations: No   Past Medical History: Patient reports that she has been seeing Dr.Shaw neurologist here in [place]. He completed an MRI  of the brain in March which was negative. She also had an EEG a month ago which was negative. She made an appointment with a neurologist in [facility] for a second opinion. She was a scheduled to have an MRI of the spine and lumbar puncture this week.  Patient suffers from asthma. Denies any history of head trauma, denies any history of seizures.  Family History: History  reviewed. No pertinent family history.  Family Psychiatric History: states that she has an aunt and a cousin that suffer from anxiety. Denies any family history of suicides, drug addiction or alcoholism.  Social History: Patient is married she has a 45-year-old daughter. She works as a Education officer, museum at a local high school. She lives with her husband and daughter in [place]. History  Alcohol Use  . Yes    History  Drug Use Not on file    Social History   Social History  . Marital Status: Married    Spouse Name: N/A  . Number of Children: N/A  . Years of Education: N/A   Social History Main Topics  . Smoking status: Never Smoker   . Smokeless tobacco: None  . Alcohol Use: Yes  . Drug Use: None  . Sexual Activity: Not Asked   Other Topics Concern  . None   Social History Narrative        Sleep: Good  Appetite:  Poor  Current Medications: Current Facility-Administered Medications  Medication Dose Route Frequency Provider Last Rate Last Dose  . acetaminophen (TYLENOL) tablet 650 mg  650 mg Oral Q6H PRN Audery Amel, MD   650 mg at 09/11/15 2128  . alum & mag hydroxide-simeth (MAALOX/MYLANTA) 200-200-20 MG/5ML suspension 30 mL  30 mL Oral Q4H PRN Audery Amel, MD      . amitriptyline (ELAVIL) tablet 25 mg  25 mg Oral QHS Jimmy Footman, MD   25 mg at 09/11/15 2129  . budesonide (PULMICORT) nebulizer solution 0.25 mg  0.25 mg Nebulization BID Jimmy Footman, MD   0.25 mg at 09/12/15 0747  . feeding supplement (ENSURE ENLIVE) (ENSURE ENLIVE) liquid 237 mL  237 mL Oral TID BM Jimmy Footman, MD   237 mL at 09/10/15 1629  . LORazepam (ATIVAN) tablet 0.5 mg  0.5 mg Oral Q6H PRN Jimmy Footman, MD   0.5 mg at 09/11/15 2232  . magnesium hydroxide (MILK OF MAGNESIA) suspension 30 mL  30 mL Oral Daily PRN Audery Amel, MD      . meloxicam (MOBIC) tablet 15 mg  15 mg Oral Daily  Jimmy Footman, MD   15 mg at 09/11/15 0920  . OLANZapine zydis (ZYPREXA) disintegrating tablet 20 mg  20 mg Oral QHS Jimmy Footman, MD   20 mg at 09/11/15 2130    Lab Results:  No results found for this or any previous visit (from the past 48 hour(s)). Shaughnessy, Gethers [id number] 07/28/2015 11:14:04 AM  DESCRIPTION:  Electroencephalogram of above mentioned patient was acquired with Ernest[facility] [employer] equipment. Electrodes were placed according to internationally standardized 10-20 system, after individualized distance measurement. The study was recorded digitally with a bandpass of 1-70 Hz and a sampling rate of at least  and was reviewed with the possibility of multiple reformatting. There was an EKG channel.  The EEG during wakefulness demonstrated 9-10 Hz occipital dominant alpha rhythm that is symmetrically distributed and reactive to eye opening and closure. There were no epileptiform discharges. Hyperventilation did not induce paroxysmal activity. Photic stimulation did not  induce seizure activity and photic driving response was not seen. Movement, electrode and muscle artifact was present during brief part of the record.   Patient entered stage I but stage II sleep was not recorded. No abnormal activation occurred during sleep or at the time of arousal.  IMPRESSION: This is a normal record, without EEG evidence for focality or epileptiform discharges.  CLINICAL CORRELATION: This EEG finding did not support the seizure focus.  MRI Brain 02/02/15 REASON FOR EXAM:    Abnormal MRI Head   Arachnoid Cyst COMMENTS:     PROCEDURE: MR  - MR BRAIN WO/W CONTRAST  - Feb 02 2015 11:40AM   CLINICAL DATA:  Recurrent headaches. Arachnoid cyst. Left-sided tremors. Numbness in the left leg. Aches and pains.  EXAM: MRI HEAD WITHOUT AND WITH CONTRAST  TECHNIQUE: Multiplanar, multiecho pulse sequences of the brain and surrounding structures were obtained without and with intravenous  contrast. CONTRAST:  15 mL MultiHance  COMPARISON:  MRI of the head and IAC's 12/25/2012  FINDINGS: No acute infarct, hemorrhage, or mass lesion is present. A retrocerebellar cyst is stable. There is no associated enhancement or significant mass effect.  Flow is present in the major intracranial arteries. The globes and orbits are intact. The right maxillary sinus is near completely opacified. A fluid level is present in the left maxillary sinus. There is diffuse mucosal disease throughout the ethmoid air cells, and bilateral sphenoid sinuses. The right frontal sinus is near completely opacified. The mastoid air cells are clear. The The postcontrast images demonstrate stable appearance of a developmental venous anomaly in the posterior left parietal lobe. There is no associated cavernous malformation.   IMPRESSION: 1. Stable retrocerebellar arachnoid cyst, unlikely to be of clinical consequence to the patient. 2. Stable left parietal developmental venous anomaly. 3. Diffuse sinus disease. This could be related to the patient's headaches.  Physical Findings: AIMS: Facial and Oral Movements Muscles of Facial Expression: None, normal Lips and Perioral Area: None, normal Jaw: None, normal Tongue: None, normal,Extremity Movements Upper (arms, wrists, hands, fingers): None, normal Lower (legs, knees, ankles, toes): None, normal, Trunk Movements Neck, shoulders, hips: None, normal, Overall Severity Severity of abnormal movements (highest score from questions above): None, normal Incapacitation due to abnormal movements: None, normal Patient's awareness of abnormal movements (rate only patient's report): No Awareness, Dental Status Current problems with teeth and/or dentures?: No Does patient usually wear dentures?: No  CIWA:  CIWA-Ar Total: 0 COWS:     Musculoskeletal: Strength & Muscle Tone: within normal limits Gait & Station: normal Patient leans: N/A  Psychiatric  Specialty Exam: Review of Systems  Constitutional: Negative.   Eyes: Negative.   Respiratory: Negative.   Cardiovascular: Negative.   Gastrointestinal: Negative.   Genitourinary: Negative.   Musculoskeletal: Negative.   Skin: Negative.   Neurological: Positive for sensory change, loss of consciousness and headaches.  Endo/Heme/Allergies: Negative.   Psychiatric/Behavioral: The patient is nervous/anxious.     Blood pressure 126/82, pulse 116, temperature 98 F (36.7 C), temperature source Oral, resp. rate 18, height  (1.854 m), weight 72.576 kg (160 lb), last menstrual period 08/31/2015, SpO2 98 %.Body mass index is 21.11 kg/(m^2).  General Appearance: Fairly Groomed  Patent attorney::  Fair  Speech:  Normal Rate  Volume:  Normal  Mood:  Anxious and Dysphoric  Affect:  Blunt  Thought Process:  Linear  Orientation:  Full (Time, Place, and Person)  Thought Content:  Delusions  Suicidal Thoughts:  No  Homicidal Thoughts:  No  Memory:  Immediate;   Good Recent;   Good Remote;   Good  Judgement:  Impaired  Insight:  Shallow  Psychomotor Activity:  Normal  Concentration:  Good  Recall:  NA  Fund of Knowledge:Good  Language: Good  Akathisia:  No  Handed:    AIMS (if indicated):     Assets:  SolicitorCommunication Skills Financial Resources/Insurance Housing Physical Health Social Support Talents/Skills Vocational/Educational  ADL's:  Intact  Cognition: WNL  Sleep:  Number of Hours: 5   Treatment Plan Summary: Daily contact with patient to assess and evaluate symptoms and progress in treatment and Medication management   Patient is a 29 year old married Caucasian female who appears to be displaying somatic delusions of parasitosis. Patient has had a multitude of testing that has failed to explain her symptoms. Patient continues to seek medical treatment and testing in order to find out was causing the headaches and what are the things coming out of her body fluids.  Delusions  of parasites psychosis: Continue olanzapine  20 mg qhs. Mild improvement noted today.  Anxiety: I have ordered Ativan 0.5 mg every 6 hours when necessary  Insomnia: I have ordered Ativan daily at bedtime when necessary for insomnia  Headaches:  Continue Elavil 25 mg at bedtime.  Patient not as somatic today.  Will attempt MRI of the brain tomorrow.  Neurologist is following the patient.  Agrees with diagnosis of delusional disorder.    Asthma: Patient will be continued tinea on Pulmicort  Labs: Hemoglobin A1c, lipid panel and TSH are all within the normal limits. Stool Sample was collected today.  Prior to sample was not properly collected.  Diet regular diet  Precautions every 15 minute checks  Vital signs: Continue vitals twice a day.  Collateral information: Patient's family husband and patient's mother ha been contacted.  Discharge disposition: Once stable the patient will be discharged back to her home and will continue to follow-up with outpatient psychiatrist.  Jimmy FootmanHernandez-Gonzalez,  Milano Rosevear 09/12/2015, 3:29 PM

## 2015-09-12 NOTE — Progress Notes (Signed)
Recreation Therapy Notes  Date: 10.18.16 Time: 3:00 pm Location: Craft Room  Group Topic: Goal Setting  Goal Area(s) Addresses:  Patient will write one goal. Patient will verbalize benefit of setting goals.  Behavioral Response: Attentive, Interactive  Intervention: Step By Step  Activity: Patients were given a worksheet with a foot on it. Patients were instructed to write a goal on the inside of the foot and write positive words on the outside of the foot.  Education: LRT educated patients on ways the can keep themselves focused on their goals.  Education Outcome: Acknowledges education/In group clarification offered  Clinical Observations/Feedback: Patient completed activity by writing a goal and positive words. Patient contributed to group discussion by stating that it was easy to set a goal and why, and what steps she is taking to reach her goal.  Jacquelynn CreeGreene,Joziah Dollins M, LRT/CTRS 09/12/2015 4:19 PM

## 2015-09-12 NOTE — Progress Notes (Signed)
Pt remains anxious and somatic. C/o head pain. Pain scale 8/10. States she has parasites in her blood and unable to walk. Pt refused ensure states it upset her stomach.  Ativan 0.5mg  given at 2232 and tylenol 650mg  given at 2128 as ordered PRN.  tol po bedtime medications well. Denies SI/HI/AV/H. q 15 min checks maintained for safety. Slept 5 hours.

## 2015-09-12 NOTE — Plan of Care (Signed)
Problem: [facility] Participation in Recreation Therapeutic Interventions Goal: STG-Other Recreation Therapy Goal (Specify) STG: Self-expression - Within 6 treatment sessions, patient will participate in self-expression activity in each of 4 treatment sessions to increase self-expression post d/c.  Outcome: Progressing Treatment Session 2; Completed 2 out of 2: At approximately 1:05 pm, LRT met with patient in patient room. LRT explained self-expression activity. Patient participated in activity and verbalized benefit.  Leonette Monarch, LRT/CTRS 10.18.16 1:32 pm

## 2015-09-12 NOTE — Progress Notes (Signed)
Visitation by husband, Ephriam KnucklesChristian, went well according to the patient; A&Ox3, c/o HA 7/10 (Tylenol 650 mg PO PRN given), partially attended the Wrap Up Group, c/o dizziness; patient holding on the rails in the hallway going back and forth to room.

## 2015-09-13 ENCOUNTER — Inpatient Hospital Stay: Payer: BC Managed Care – PPO

## 2015-09-13 MED ORDER — GADOBENATE DIMEGLUMINE 529 MG/ML IV SOLN
15.0000 mL | Freq: Once | INTRAVENOUS | Status: AC | PRN
  Administered 2015-09-13: 15 mL via INTRAVENOUS

## 2015-09-13 NOTE — [facility] Group Notes (Signed)
[facility]   09/13/2015 12:45 PM  Patient did not attend.  Jenel LucksJasmine Lewis, Clinical Social Work Intern 09/13/15   Beryl MeagerJason Catelynn Sparger, MSW, Theresia MajorsLCSWA 09/14/15

## 2015-09-13 NOTE — Progress Notes (Signed)
[facility] MD Progress Note  09/13/2015 12:24 PM Tricia Moran  MRN:  [id number] Subjective:   Patient appears to be improving today she was seen attending group. During assessment she was not as anxious and actually smile and reported feeling better. She states that the issues with headaches have improved the only issue right now seems to be her gait and she feels that her gait is unsteady at times and still has to hold from the rails. Patient continues to be concerned about the results of the MRI and wants to be informed of the results as soon as possible.  Patient denies depression, problems with his sleep, appetite, energy or concentration. Denies SI, HI or having auditory or visual hallucinations. Denies side effects from medications. Denies physical complaints other than what is mentioned above.   Per nursing: Visitation by husband, Ephriam Knuckles, went well according to the patient; A&Ox3, c/o HA 7/10 (Tylenol 650 mg PO PRN given), partially attended the Wrap Up Group, c/o dizziness; patient holding on the rails in the hallway going back and forth to room.  Principal Problem: Delusional disorder, somatic type (HCC) Diagnosis:   Patient Active Problem List   Diagnosis Date Noted  . 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   Past Medical History: Patient reports that she has been seeing Dr.Shaw neurologist here in [place]. He completed an MRI of the brain in March which was negative. She also had an EEG a month ago which was negative. She made an appointment with a neurologist in [facility] for a second opinion. She was a scheduled to have an MRI of the spine and lumbar puncture this week.  Patient suffers from asthma. Denies any history of head trauma, denies any history of seizures.  Family History: History reviewed. No pertinent family  history.  Family Psychiatric History: states that she has an aunt and a cousin that suffer from anxiety. Denies any family history of suicides, drug addiction or alcoholism.  Social History: Patient is married she has a 5-year-old daughter. She works as a Education officer, museum at a local high school. She lives with her husband and daughter in [place]. History  Alcohol Use  . Yes    History  Drug Use Not on file    Social History   Social History  . Marital Status: Married    Spouse Name: N/A  . Number of Children: N/A  . Years of Education: N/A   Social History Main Topics  . Smoking status: Never Smoker   . Smokeless tobacco: None  . Alcohol Use: Yes  . Drug Use: None  . Sexual Activity: Not Asked   Other Topics Concern  . None   Social History Narrative        Sleep: Good  Appetite:  Poor  Current Medications: Current Facility-Administered Medications  Medication Dose Route Frequency Provider Last Rate Last Dose  . acetaminophen (TYLENOL) tablet 650 mg  650 mg Oral Q6H PRN Audery Amel, MD   650 mg at 09/12/15 2210  . alum & mag hydroxide-simeth (MAALOX/MYLANTA) 200-200-20 MG/5ML suspension 30 mL  30 mL Oral Q4H PRN Audery Amel, MD      . amitriptyline (ELAVIL) tablet 25 mg  25 mg Oral QHS Jimmy Footman, MD   25 mg at 09/12/15 2203  . budesonide (PULMICORT) nebulizer solution 0.25 mg  0.25 mg Nebulization BID Jimmy Footman,  MD   0.25 mg at 09/13/15 4098  . feeding supplement (ENSURE ENLIVE) (ENSURE ENLIVE) liquid 237 mL  237 mL Oral TID BM Jimmy Footman, MD   237 mL at 09/10/15 1629  . LORazepam (ATIVAN) tablet 0.5 mg  0.5 mg Oral Q6H PRN Jimmy Footman, MD   0.5 mg at 09/11/15 2232  . magnesium hydroxide (MILK OF MAGNESIA) suspension 30 mL  30 mL Oral Daily PRN Audery Amel, MD      . OLANZapine (ZYPREXA) tablet 20 mg  20 mg Oral QHS Jimmy Footman, MD    20 mg at 09/12/15 2203    Lab Results:  No results found for this or any previous visit (from the past 48 hour(s)). Bonni, Neuser [id number] 07/28/2015 11:14:04 AM  DESCRIPTION:  Electroencephalogram of above mentioned patient was acquired with [facility] [employer] equipment. Electrodes were placed according to internationally standardized 10-20 system, after individualized distance measurement. The study was recorded digitally with a bandpass of 1-70 Hz and a sampling rate of at least  and was reviewed with the possibility of multiple reformatting. There was an EKG channel.  The EEG during wakefulness demonstrated 9-10 Hz occipital dominant alpha rhythm that is symmetrically distributed and reactive to eye opening and closure. There were no epileptiform discharges. Hyperventilation did not induce paroxysmal activity. Photic stimulation did not induce seizure activity and photic driving response was not seen. Movement, electrode and muscle artifact was present during brief part of the record.   Patient entered stage I but stage II sleep was not recorded. No abnormal activation occurred during sleep or at the time of arousal.  IMPRESSION: This is a normal record, without EEG evidence for focality or epileptiform discharges.  CLINICAL CORRELATION: This EEG finding did not support the seizure focus.  MRI Brain 02/02/15 REASON FOR EXAM:    Abnormal MRI Head   Arachnoid Cyst COMMENTS:     PROCEDURE: MR  - MR BRAIN WO/W CONTRAST  - Feb 02 2015 11:40AM   CLINICAL DATA:  Recurrent headaches. Arachnoid cyst. Left-sided tremors. Numbness in the left leg. Aches and pains.  EXAM: MRI HEAD WITHOUT AND WITH CONTRAST  TECHNIQUE: Multiplanar, multiecho pulse sequences of the brain and surrounding structures were obtained without and with intravenous contrast. CONTRAST:  15 mL MultiHance  COMPARISON:  MRI of the head and IAC's 12/25/2012  FINDINGS: No acute infarct, hemorrhage, or mass lesion is  present. A retrocerebellar cyst is stable. There is no associated enhancement or significant mass effect.  Flow is present in the major intracranial arteries. The globes and orbits are intact. The right maxillary sinus is near completely opacified. A fluid level is present in the left maxillary sinus. There is diffuse mucosal disease throughout the ethmoid air cells, and bilateral sphenoid sinuses. The right frontal sinus is near completely opacified. The mastoid air cells are clear. The The postcontrast images demonstrate stable appearance of a developmental venous anomaly in the posterior left parietal lobe. There is no associated cavernous malformation.   IMPRESSION: 1. Stable retrocerebellar arachnoid cyst, unlikely to be of clinical consequence to the patient. 2. Stable left parietal developmental venous anomaly. 3. Diffuse sinus disease. This could be related to the patient's headaches.  Physical Findings: AIMS: Facial and Oral Movements Muscles of Facial Expression: None, normal Lips and Perioral Area: None, normal Jaw: None, normal Tongue: None, normal,Extremity Movements Upper (arms, wrists, hands, fingers): None, normal Lower (legs, knees, ankles, toes): None, normal, Trunk Movements Neck, shoulders, hips: None, normal, Overall Severity Severity of  abnormal movements (highest score from questions above): None, normal Incapacitation due to abnormal movements: None, normal Patient's awareness of abnormal movements (rate only patient's report): No Awareness, Dental Status Current problems with teeth and/or dentures?: No Does patient usually wear dentures?: No  CIWA:  CIWA-Ar Total: 0 COWS:     Musculoskeletal: Strength & Muscle Tone: within normal limits Gait & Station: normal Patient leans: N/A  Psychiatric Specialty Exam: Review of Systems  Constitutional: Negative.   HENT:       Much improved  Eyes: Negative.   Respiratory: Negative.   Cardiovascular:  Negative.   Gastrointestinal: Negative.   Genitourinary: Negative.   Musculoskeletal: Negative for myalgias, back pain, joint pain, falls and neck pain.       Still has difficulties walking  Skin: Negative.   Neurological: Positive for headaches. Negative for dizziness, tingling, tremors, sensory change, speech change, focal weakness, seizures and loss of consciousness.  Endo/Heme/Allergies: Negative.   Psychiatric/Behavioral: Negative for depression and suicidal ideas. The patient is not nervous/anxious.     Blood pressure 122/86, pulse 78, temperature 98 F (36.7 C), temperature source Oral, resp. rate 18, height 6\' 1"  (1.854 m), weight 72.576 kg (160 lb), last menstrual period 08/31/2015, SpO2 100 %.Body mass index is 21.11 kg/(m^2).  General Appearance: Fairly Groomed  Patent attorneyye Contact::  Fair  Speech:  Normal Rate  Volume:  Normal  Mood:  Anxious and Dysphoric  Affect:  Blunt  Thought Process:  Linear  Orientation:  Full (Time, Place, and Person)  Thought Content:  Delusions  Suicidal Thoughts:  No  Homicidal Thoughts:  No  Memory:  Immediate;   Good Recent;   Good Remote;   Good  Judgement:  Impaired  Insight:  Shallow  Psychomotor Activity:  Normal  Concentration:  Good  Recall:  NA  Fund of Knowledge:Good  Language: Good  Akathisia:  No  Handed:    AIMS (if indicated):     Assets:  ArchitectCommunication Skills Financial Resources/Insurance Housing Physical Health Social Support Talents/Skills Vocational/Educational  ADL's:  Intact  Cognition: WNL  Sleep:  Number of Hours: 7   Treatment Plan Summary: Daily contact with patient to assess and evaluate symptoms and progress in treatment and Medication management   Patient is a 29 year old married Caucasian female who appears to be displaying somatic delusions of parasitosis. Patient has had a multitude of testing that has failed to explain her symptoms. Patient continues to seek medical treatment and testing in order to find  out was causing the headaches and what are the things coming out of her body fluids.  Delusions of parasites psychosis: Continue olanzapine  20 mg qhs. Patient was seen attending group today. She reports improvement of the headaches her only somatic concern today was difficulty walking since but other than that she did not voice any other issues.  Plan to have a family meeting tomorrow to educate patient and family about the diagnosis and it treatment.    Anxiety: I have ordered Ativan 0.5 mg every 6 hours when necessary  Insomnia: I have ordered Ativan daily at bedtime when necessary for insomnia  Headaches:  Continue Elavil 25 mg at bedtime.  Patient not as somatic today.  Will attempt MRI of the brain tomorrow.  Neurologist is following the patient.  Agrees with diagnosis of delusional disorder.    Asthma: Patient will be continued tinea on Pulmicort  Labs: Hemoglobin A1c, lipid panel and TSH are all within the normal limits. Stool Sample to be collected today.  Prior to sample was not properly collected.  Imaging:  Brain MRI completed on 10/19  Compared with prior MRI and CT, no significant change. IMPRESSION: Negative exam. No acute intracranial findings.  Diet regular diet  Precautions every 15 minute checks  Vital signs: Continue vitals twice a day.  Collateral information: Patient's family husband and patient's mother ha been contacted.  Discharge disposition: Once stable the patient will be discharged back to her home and will continue to follow-up with outpatient psychiatrist.  Will discuss discharge planning tomorrow during family meeting  Jimmy Footman 09/13/2015, 12:24 PM

## 2015-09-13 NOTE — Progress Notes (Signed)
Neurology: Still complaining of multitude of symptoms.  Was not able to tolerate MRI yesterday. D/w MRI, it will be re attempted today.  Pauletta BrownsZEYLIKMAN, Anica Alcaraz

## 2015-09-13 NOTE — Plan of Care (Signed)
Problem: Ineffective individual coping Goal: STG: Patient will remain free from self harm Outcome: Progressing Medications (Amitriptyline 25 mg and Zyprexa 20 mg - New) administered as ordered by the physician, medications Therapeutic Effects, SEs and Adverse effects discussed, questions encouraged; Tylenol 650 mg PRN given, 15 minute checks maintained for safety, clinical and moral support provided, patient encouraged to continue to express feelings and demonstrate safe care. Patient remain free from harm, will continue to monitor.

## 2015-09-13 NOTE — Plan of Care (Signed)
Problem: Alteration in thought process Goal: LTG-Patient verbalizes understanding importance med regimen (Patient verbalizes understanding of importance of medication regimen and need to continue outpatient care.)  Outcome: Progressing Compliant with medication

## 2015-09-13 NOTE — Progress Notes (Signed)
D: Patient able tor go for MRI and talk with doctor about results . Observed patient telling her husband over the phone . Attending unit programs. Affect less anxious this shift . A: Instruction  Given on medication , verbalizing  Understanding of medications  . R: Patient receptive to information  received

## 2015-09-13 NOTE — Progress Notes (Signed)
Recreation Therapy Notes  Date: 10.19.16 Time: 3:00 pm Location: Craft Room  Group Topic: Self-esteem  Goal Area(s) Addresses:  Patient will identify positive attributes about self. Patient will identify at least one healthy coping skill.  Behavioral Response: Did not attend  Intervention: All About Me  Activity: Patients were instructed to make an All About Me pamphlet listing their life's motto, positive traits, healthy coping skills, and their healthy support system.  Education: LRT educated patient on ways they can increase their self-esteem   Education Outcome: Patient did not attend group.   Clinical Observations/Feedback: Patient did not attend group.  Jacquelynn CreeGreene,Joakim Huesman M, LRT/CTRS 09/13/2015 4:17 PM

## 2015-09-13 NOTE — [facility] Group Notes (Signed)
[facility] Group Notes:  (Nursing/MHT/Case Management/Adjunct)  Date:  09/13/2015  Time:  2:39 PM  Type of Therapy:  Psychoeducational Skills  Participation Level:  Did Not Attend   Lynelle SmokeCara Travis [facility] 09/13/2015, 2:39 PM

## 2015-09-14 ENCOUNTER — Encounter: Payer: Self-pay | Admitting: Psychiatry

## 2015-09-14 DIAGNOSIS — J329 Chronic sinusitis, unspecified: Secondary | ICD-10-CM

## 2015-09-14 DIAGNOSIS — K589 Irritable bowel syndrome without diarrhea: Secondary | ICD-10-CM

## 2015-09-14 MED ORDER — OLANZAPINE 20 MG PO TABS: 20.0000 mg | ORAL_TABLET | Freq: Every day | ORAL | Status: DC

## 2015-09-14 NOTE — Plan of Care (Signed)
Problem: Ineffective individual coping Goal: STG: Patient will remain free from self harm Outcome: Not Met (add Reason) Calm and cooperative. No injuries noted. No voiced thoughts of hurting herself. q 15 min checks maintained.

## 2015-09-14 NOTE — [facility] Group Notes (Signed)
[facility] LCSW Group Therapy  09/14/2015 1:37 PM   Type of Therapy:  Group Therapy  Participation Level:  Did Not Attend  Modes of Intervention:  Discussion, Education, Socialization and Support  Summary of Progress/Problems: Balance in life: Patients will discuss the concept of balance and how it looks and feels to be unbalanced. Pt will identify areas in their life that is unbalanced and ways to become more balanced.   Doctor Sheahan L Kassie Keng MSW, LCSWA  09/14/2015, 1:37 PM  

## 2015-09-14 NOTE — [facility] Suicide Risk Assessment (Signed)
[facility] INPATIENT:  Family/Significant Other Suicide Prevention Education  Suicide Prevention Education:  Education Completed; Charlesetta ShanksChristian Mee (husband) [phone] and Lucille PassyDiane Thompson (mother) [phone] has been identified by the patient as the family member/significant other with whom the patient will be residing, and identified as the person(s) who will aid the patient in the event of a mental health crisis (suicidal ideations/suicide attempt).  With written consent from the patient, the family member/significant other has been provided the following suicide prevention education, prior to the and/or following the discharge of the patient.  The suicide prevention education provided includes the following:  Suicide risk factors  Suicide prevention and interventions  National Suicide Hotline telephone number  [facility] assessment telephone number  [facility] Emergency Assistance 911  [facility] and/or Residential Mobile Crisis Unit telephone number  Request made of family/significant other to:  Remove weapons (e.g., guns, rifles, knives), all items previously/currently identified as safety concern.    Remove drugs/medications (over-the-counter, prescriptions, illicit drugs), all items previously/currently identified as a safety concern.  The family member/significant other verbalizes understanding of the suicide prevention education information provided.  The family member/significant other agrees to remove the items of safety concern listed above.  Lulu RidingIngle, Pristine Gladhill T, MSW, LCSWA 09/14/2015, 3:10 PM

## 2015-09-14 NOTE — Plan of Care (Signed)
Problem: [facility] Participation in Recreation Therapeutic Interventions Goal: STG-Other Recreation Therapy Goal (Specify) STG: Self-expression - Within 6 treatment sessions, patient will participate in self-expression activity in each of 4 treatment sessions to increase self-expression post d/c.  Outcome: Progressing Treatment Session 3; Completed 3 out of 4: At approximately 2:25 pm, LRT met with patient in craft room. LRT educated self-expression activity. Patient participated in activity. LRT educated patient on other forms of self-expression. LRT encouraged patient to use forms of self-expression to get her emotions out.  Leonette Monarch, LRT/CTRS 10.20.16 2:37 pm

## 2015-09-14 NOTE — Progress Notes (Signed)
Isolates to self and room. Interacts appropriately with roommate. Did not attend group. Possible discharge tomorrow. Will continue to assess and monitor for safety.

## 2015-09-14 NOTE — Clinical Social Work Note (Signed)
Faxed patient referal for Dr. Jimmye Norman at [place] to [phone] and will schedule patient at [facility] until patient can be seen by Dr. Jimmye Norman. CSW met with patient, CSW intern, patients husband and mother, and MD for family meeting. Patient will discharge home tomorrow and discussed medications at discharge as well as FMLA. CSW gave patient family [employer] brochure for family support services. Staff discussed how patient symptoms can be a burden on family such as her delusions of illness and getting testting can be a financial burden and create stress for the entire family and discussed having their daughter tested would cause APS to get involved if there is not a good reason to put her through lots of testing. Patient's husband and mother are supportive and patient agreed to run things by her family before making decisions.

## 2015-09-14 NOTE — [facility] Group Notes (Signed)
[facility] Group Notes:  (Nursing/MHT/Case Management/Adjunct)  Date:  09/14/2015  Time:  3:51 PM  Type of Therapy:  Psychoeducational Skills  Participation Level:  Did Not Attend   [name] 09/14/2015, 3:51 PM 

## 2015-09-14 NOTE — Plan of Care (Signed)
Problem: Ineffective individual coping Goal: STG: Patient will remain free from self harm Outcome: Progressing No self harm reported or observed Goal: STG:Pt. will utilize relaxation techniques to reduce stress STG: Patient will utilize relaxation techniques to reduce stress levels  Outcome: Progressing Pt resting in bed, eyes closed most of shift

## 2015-09-14 NOTE — Progress Notes (Signed)
[facility] MD Progress Note  09/14/2015 11:52 AM Tricia Moran  MRN:  [id number] Subjective:   1 hour-long family meeting was held today. Patient her husband and her mother attended. This Probation officer and the Environmental manager.  Diagnosis of delusional disorder was discussed with the patient and the family. The medication and their side effects were discussed at length with the family. The follow-up plan was also discussed. Patient continues to want to follow up with a neurologist in [place] who had recommended a lumbar puncture and a spinal MRI.  Patient does not want me to fax any paperwork to them as she wants a fresh look at her.  Today the patient reports decrease in the severity of the migraines and the numbness she continues to report having difficulties walking and is seen holding from the rail on the walls.  Denies SI, HI or having auditory or visual hallucinations.   Per nursing: D: Patient able tor go for MRI and talk with doctor about results . Observed patient telling her husband over the phone . Attending unit programs. Affect less anxious this shift . A: Instruction Given on medication , verbalizing Understanding of medications . 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   Past Medical History: Patient reports that she has been seeing Dr.Shaw neurologist here in [place]. He completed an MRI of the brain in March which was negative. She also had an EEG a month ago which was negative. She made an appointment with a neurologist in [facility] for a second opinion. She was a scheduled to have an MRI of the spine and lumbar puncture this week.  Patient  suffers from asthma. Denies any history of head trauma, denies any history of seizures.  Family History: History reviewed. No pertinent family history.  Family Psychiatric History: states that she has an aunt and a cousin that suffer from anxiety. Denies any family history of suicides, drug addiction or alcoholism.  Social History: Patient is married she has a 70-year-old daughter. 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 She lives with her husband and daughter in [place]. History  Alcohol Use  . Yes    History  Drug Use Not on file    Social History   Social History  . Marital Status: Married    Spouse Name: N/A  . Number of Children: N/A  . Years of Education: N/A   Social History Main Topics  . Smoking status: Never Smoker   . Smokeless tobacco: None  . Alcohol Use: Yes  . Drug Use: None  . Sexual Activity: Not Asked   Other Topics Concern  . None   Social History Narrative        Sleep: Good  Appetite:  Good  Current Medications: Current Facility-Administered Medications  Medication Dose Route Frequency Provider Last Rate Last Dose  . acetaminophen (TYLENOL) tablet 650 mg  650 mg Oral Q6H PRN Gonzella Lex, MD   650 mg at 09/12/15 2210  . alum & mag hydroxide-simeth (MAALOX/MYLANTA) 200-200-20 MG/5ML suspension 30 mL  30 mL Oral Q4H PRN Gonzella Lex, MD      . amitriptyline (ELAVIL) tablet 25 mg  25 mg Oral  QHS Hildred Priest, MD   25 mg at 09/13/15 2131  . budesonide (PULMICORT) nebulizer solution 0.25 mg  0.25 mg Nebulization BID Hildred Priest, MD   0.25 mg at 09/14/15 0834  . LORazepam (ATIVAN) tablet 0.5 mg  0.5 mg Oral Q6H PRN Hildred Priest, MD   0.5 mg at 09/11/15 2232  . magnesium hydroxide (MILK OF MAGNESIA) suspension 30 mL  30 mL Oral Daily PRN Gonzella Lex, MD      . OLANZapine (ZYPREXA) tablet 20 mg  20 mg Oral QHS Hildred Priest, MD    20 mg at 09/13/15 2132    Lab Results:  No results found for this or any previous visit (from the past 48 hour(s)). Tricia Moran, [place] [id number] 07/28/2015 11:14:04 AM  DESCRIPTION:  Electroencephalogram of above mentioned patient was acquired with  Sink equipment. Electrodes were placed according to internationally standardized 10-20 system, after individualized distance measurement. The study was recorded digitally with a bandpass of 1-70 Hz and a sampling rate of at least 200Hz  and was reviewed with the possibility of multiple reformatting. There was an EKG channel.  The EEG during wakefulness demonstrated 9-10 Hz occipital dominant alpha rhythm that is symmetrically distributed and reactive to eye opening and closure. There were no epileptiform discharges. Hyperventilation did not induce paroxysmal activity. Photic stimulation did not induce seizure activity and photic driving response was not seen. Movement, electrode and muscle artifact was present during brief part of the record.   Patient entered stage I but stage II sleep was not recorded. No abnormal activation occurred during sleep or at the time of arousal.  IMPRESSION: This is a normal record, without EEG evidence for focality or epileptiform discharges.  CLINICAL CORRELATION: This EEG finding did not support the seizure focus.  MRI Brain 02/02/15 REASON FOR EXAM:    Abnormal MRI Head   Arachnoid Cyst COMMENTS:     PROCEDURE: MR  - MR BRAIN WO/W CONTRAST  - Feb 02 2015 11:40AM   CLINICAL DATA:  Recurrent headaches. Arachnoid cyst. Left-sided tremors. Numbness in the left leg. Aches and pains.  EXAM: MRI HEAD WITHOUT AND WITH CONTRAST  TECHNIQUE: Multiplanar, multiecho pulse sequences of the brain and surrounding structures were obtained without and with intravenous contrast. CONTRAST:  15 mL MultiHance  COMPARISON:  MRI of the head and IAC's 12/25/2012  FINDINGS: No acute infarct, hemorrhage, or mass lesion is  present. A retrocerebellar cyst is stable. There is no associated enhancement or significant mass effect.  Flow is present in the major intracranial arteries. The globes and orbits are intact. The right maxillary sinus is near completely opacified. A fluid level is present in the left maxillary sinus. There is diffuse mucosal disease throughout the ethmoid air cells, and bilateral sphenoid sinuses. The right frontal sinus is near completely opacified. The mastoid air cells are clear. The The postcontrast images demonstrate stable appearance of a developmental venous anomaly in the posterior left parietal lobe. There is no associated cavernous malformation.   IMPRESSION: 1. Stable retrocerebellar arachnoid cyst, unlikely to be of clinical consequence to the patient. 2. Stable left parietal developmental venous anomaly. 3. Diffuse sinus disease. This could be related to the patient's headaches.  Physical Findings: AIMS: Facial and Oral Movements Muscles of Facial Expression: None, normal Lips and Perioral Area: None, normal Jaw: None, normal Tongue: None, normal,Extremity Movements Upper (arms, wrists, hands, fingers): None, normal Lower (legs, knees, ankles, toes): None, normal, Trunk Movements Neck, shoulders, hips: None, normal, Overall Severity Severity of abnormal  movements (highest score from questions above): None, normal Incapacitation due to abnormal movements: None, normal Patient's awareness of abnormal movements (rate only patient's report): No Awareness, Dental Status Current problems with teeth and/or dentures?: No Does patient usually wear dentures?: No  CIWA:  CIWA-Ar Total: 0 COWS:     Musculoskeletal: Strength & Muscle Tone: within normal limits Gait & Station: normal Patient leans: N/A  Psychiatric Specialty Exam: Review of Systems  Constitutional: Negative.   HENT:       Much improved  Eyes: Negative.   Respiratory: Negative.   Cardiovascular:  Negative.   Gastrointestinal: Negative.   Genitourinary: Negative.   Musculoskeletal: Negative.  Negative for myalgias, back pain, joint pain, falls and neck pain.       Still has difficulties walking  Skin: Negative.   Neurological: Positive for headaches. Negative for dizziness, tingling, tremors, sensory change, speech change, focal weakness, seizures and loss of consciousness.  Endo/Heme/Allergies: Negative.   Psychiatric/Behavioral: Negative for depression and suicidal ideas. The patient is not nervous/anxious.     Blood pressure 108/66, pulse 101, temperature 97.9 F (36.6 C), temperature source Oral, resp. rate 18, height 6' 1"  (1.854 m), weight 72.576 kg (160 lb), last menstrual period 08/31/2015, SpO2 99 %.Body mass index is 21.11 kg/(m^2).  General Appearance: Well Groomed  Engineer, water::  Fair  Speech:  Normal Rate  Volume:  Normal  Mood:  Euthymic  Affect:  Appropriate and Congruent  Thought Process:  Linear  Orientation:  Full (Time, Place, and Person)  Thought Content:  Delusions  Suicidal Thoughts:  No  Homicidal Thoughts:  No  Memory:  Immediate;   Good Recent;   Good Remote;   Good  Judgement:  Fair  Insight:  Shallow  Psychomotor Activity:  Normal  Concentration:  Good  Recall:  No cognitive issues  Fund of Knowledge:Good  Language: Good  Akathisia:  No  Handed:    AIMS (if indicated):     Assets:  Agricultural consultant Housing Physical Health Social Support Talents/Skills Vocational/Educational  ADL's:  Intact  Cognition: WNL  Sleep:  Number of Hours: 8.15   Treatment Plan Summary: Daily contact with patient to assess and evaluate symptoms and progress in treatment and Medication management   Patient is a 29 year old married Caucasian female who appears to be displaying somatic delusions of parasitosis. Patient has had a multitude of testing that has failed to explain her symptoms. Patient continues to seek medical  treatment and testing in order to find out was causing the headaches and what are the things coming out of her body fluids.  Delusions of parasites psychosis: Continue olanzapine  20 mg qhs. Patient is improving. She is less somatic. She has been attending groups.  Not as distressed about her somatic concerns. Family meeting held today.  Anxiety: I have ordered Ativan 0.5 mg every 6 hours when necessary  Insomnia: I have ordered Ativan daily at bedtime when necessary for insomnia  Headaches:  I plan to discontinue Elavil as patient has been complaining of feeling tired and sedated. MRI of the brain with and without contrast showed no change from prior MRI.   Asthma: Patient will be continued tinea on Pulmicort  Labs: Hemoglobin A1c, lipid panel and TSH are all within the normal limits.  Imaging:  Brain MRI completed on 10/19  Compared with prior MRI and CT, no significant change. IMPRESSION: Negative exam. No acute intracranial findings.  Diet regular diet  Precautions every 15 minute checks  Vital signs:  Continue vitals twice a day.  Collateral information: Patient's family husband and patient's mother ha been contacted.  Family meeting held today. Met with patient and her family for about an hour along with the Education officer, museum. Family had a lot of questions about the diagnosis, the medications and side effects and the prognosis for the illness.  We also talked about the possible consequences of continuing to take her daughter for testing.    Discharge disposition: Plan to discharge tomorrow.  SW will arrange follow-up today.  Hildred Priest 09/14/2015, 11:52 AM

## 2015-09-14 NOTE — Progress Notes (Signed)
Recreation Therapy Notes  Date: 10.20.16 Time: 3:00 pm Location: Craft Room  Group Topic: Leisure Education  Goal Area(s) Addresses:  Patient will write at least one leisure activity. Patient will write a leisure goal.  Behavioral Response: Did not attend   Intervention: Leisure Bucket List  Activity: Patients were given a leisure bucket list worksheet and instructed to list different leisure activity they had not tried, but wanted to.  Education: LRT educated patients on what is needed to participate in leisure.  Education Outcome: Patient did not attend group.  Clinical Observations/Feedback: Patient did not attend group.  Jacquelynn CreeGreene,Shereta Crothers M, LRT/CTRS 09/14/2015 4:14 PM

## 2015-09-14 NOTE — Progress Notes (Signed)
Pt is less anxious and cooperative. Isolates in room except for medications and snacks. Med compliant. No voiced thoughts of hurting herself. No c/o pain/discomfort noted. Slept 8.15 hours.

## 2015-09-14 NOTE — Tx Team (Signed)
Interdisciplinary Treatment Plan Update (Adult)  Date:  09/14/2015 Time Reviewed:  3:17 PM  Progress in Treatment: Attending groups: Yes. Participating in groups:  Yes. Taking medication as prescribed:  Yes. Tolerating medication:  Yes. Family/Significant othe contact made:  Yes, individual(s) contacted:  patient husband Kynesha Guerin [phone] Patient understands diagnosis:  Yes. Discussing patient identified problems/goals with staff:  Yes. Medical problems stabilized or resolved:  Yes. Denies suicidal/homicidal ideation: Yes. Issues/concerns per patient self-inventory:  No. Other:  New problem(s) identified: No, Describe:  none reported  Discharge Plan or Barriers: Patient has shown some improvement but still somatic delusions but not as anxious about them. Met with family today and patient will discharge home tomorrow with referral to Dr. Jimmye Norman at [facility] and will folllowup at [facility] until she can be seen by Dr. Jimmye Norman.   Reason for Continuation of Hospitalization: Delusions   Comments:  Estimated length of stay: 1 day with discharge Friday 09/15/15  New goal(s):  Review of initial/current patient goals per problem list:   1.  Goal(s):partiipate in care plan  Met:  Yes   2.  Goal (s):eliminate delusions  Met:  No  Target date: 09/15/15  Attendees: Physician:  Merlyn Albert, MD 10/20/20163:17 PM  Nursing:   Tami Lin, RN 10/20/20163:17 PM  Other:  Carmell Austria, [place] 10/20/20163:17 PM  Other:   10/20/20163:17 PM  Other:   10/20/20163:17 PM  Other:  10/20/20163:17 PM  Other:  10/20/20163:17 PM  Other:  10/20/20163:17 PM  Other:  10/20/20163:17 PM  Other:  10/20/20163:17 PM  Other:  10/20/20163:17 PM  Other:   10/20/20163:17 PM   Scribe for Treatment Team:   Keene Breath, MSW, [place]  09/14/2015, 3:17 PM

## 2015-09-14 NOTE — Discharge Summary (Addendum)
Physician Discharge Summary Note  Patient:  Tricia Moran is an 29 y.o., female MRN:  [id number] DOB:  [birth year] Patient phone:  [phone] (home)  Patient address:   [address] [place] [address],  Total Time spent with patient: 30 minutes  Date of Admission:  09/08/2015 Date of Discharge: 09/15/15  Reason for Admission:  psychosis  Principal Problem: Delusional disorder, somatic type [facility]) Discharge Diagnoses: Patient Active Problem List   Diagnosis Date Noted  . Sinusitis, chronic [J32.9] 09/14/2015  . IBS (irritable bowel syndrome) [K58.9] 09/14/2015  . Delusional disorder, somatic type ([place]) [F22] 09/08/2015    Musculoskeletal: Strength & Muscle Tone: within normal limits Gait & Station: normal Patient leans: N/A  Psychiatric Specialty Exam: Physical Exam  Constitutional: She is oriented to person, place, and time. She appears well-developed and well-nourished.  HENT:  Head: Normocephalic and atraumatic.  Eyes: Conjunctivae and EOM are normal.  Neck: Normal range of motion.  Respiratory: Effort normal.  Musculoskeletal: Normal range of motion.  Neurological: She is alert and oriented to person, place, and time.  Skin: Skin is warm and dry.    Review of Systems  Constitutional: Negative.   HENT: Negative.   Eyes: Negative.   Respiratory: Negative.   Cardiovascular: Negative.   Gastrointestinal: Negative.   Genitourinary: Negative.   Musculoskeletal: Negative.   Skin: Negative.   Neurological: Negative.   Endo/Heme/Allergies: Negative.   Psychiatric/Behavioral: Negative.     Blood pressure 105/67, pulse 120, temperature 98 F (36.7 C), temperature source Oral, resp. rate 20, height 6' 1"  (1.854 m), weight 72.576 kg (160 lb), last menstrual period 08/31/2015, SpO2 100 %.Body mass index is 21.11 kg/(m^2).  General Appearance: Fairly Groomed  Engineer, water::  Good  Speech:  Clear and Coherent  Volume:  Normal  Mood:  Euthymic  Affect:  Congruent   Thought Process:  Linear  Orientation:  Full (Time, Place, and Person)  Thought Content:  Hallucinations: None  Suicidal Thoughts:  No  Homicidal Thoughts:  No  Memory:  Immediate;   Good Recent;   Good Remote;   Good  Judgement:  Fair  Insight:  Shallow  Psychomotor Activity:  Normal  Concentration:  Good  Recall:  NA  Fund of Knowledge:Good  Language: Good  Akathisia:  No  Handed:    AIMS (if indicated):     Assets:  Communication Skills Desire for Improvement Financial Resources/Insurance [place] Talents/Skills Transportation Vocational/Educational  ADL's:  Intact  Cognition: WNL  Sleep:  Number of Hours: 7.15    History of Present Illness: Tricia Moran is a 29 y.o. female presenting to the ED under IVC via BPD. Pt brought here for psychiatric activity for delusional thoughts and behaviors. Pt believes there are parasites in her water and others in her family are also affected.  Patient presented to the ED 0n 10/13 with complaint of headache but exhibited delusional thinking. Pt was referred to [facility] for follow up. When she was seen at [facility] today, they involuntarily committed her and sent her back to [facility]. Family reports that her symptoms have been worsening over the past 7 months.  Patient tells me today that for the last 5 months she has developed debilitating headaches. She states that they usually happen in the left side of the head and caused numbness on her left side of the body. She has been taking ibuprofen at home which doesn't help. She suspects she might be having an infestation of parasites as she has seeing larva (  which looks like small poppy seeds) on her scalp, coming out of her nose, vagina and urine. She also has noted the presence of these larva in her period. Patient has seeing these small things in the water at home. She isn't sure whether this is a parasite, an amoeba or the zika virus. She is states that at times this  small larva look like the front and back of a mosquito. Patient saw a neurologist about 2 months ago. He completed an MRI of the brain and EEG that were negative. She visited a second neurologist for a second opinion and is awaiting for a lumbar puncture denies spinal MRI. Patient also has had stool examination which has been negative. The patient went to [facility] earlier this week for headaches they discharge her from the emergency department with instructions to take Tylenol and ibuprofen. She came to our emergency department yesterday for headaches and was discharged with a follow-up to [facility] (mental health clinic). Patient went to [facility] today and based on her presentation they decided to petition her for treatment. Patient believes her husband and daughter are also showing signs of infestation. She stated that her husband behavior has changed and he is more angry. She said that his face has swelling and that he is losing more hair in the shower. She also states that her daughter face looks swollen.   Patient displays some paranoia during the assessment. She did not give me permission to talk to her husband. She did not wanted to tell me who her neurologist was initially as she wanted [place] to take "a fresh look at her". Patient also asked me if I was recording the conversation as the speaker in her room when off.  Patient stated that she has been missing work over the last week due to the severe headaches. She was very distressed and anxious during the assessment. She has been calling and then nursing staff constantly telling them that she is blacking out.   Patient denies any history of prior psychiatric treatment. Patient denies any prior history of substance abuse or alcoholism.   Associated Signs/Symptoms: Depression Symptoms: depressed mood, anxiety, (Hypo) Manic Symptoms: none Anxiety Symptoms: Excessive Worry, Psychotic Symptoms: Delusions, Paranoia, PTSD Symptoms: Negative Total Time spent  with patient: 1 hour  Past Psychiatric History: No known prior psychiatric history or treatment.  Past Medical History: Patient reports that she has been seeing Dr.Shaw neurologist here in [place]. He completed an MRI of the brain in March which was negative. She also had an EEG a month ago which was negative. She made an appointment with a neurologist in [facility] for a second opinion. She was a scheduled to have an MRI of the spine and lumbar puncture this week.  Patient suffers from asthma. Denies any history of head trauma, denies any history of seizures.  Per review of records from [facility], where patient has seen a neurologist: EEG completed this year: "This is a normal record, without EEG evidence for focality or epileptiform discharges."  MRI Brain with and without contrast 02/02/15:  TECHNIQUE: Multiplanar, multiecho pulse sequences of the brain and surrounding structures were obtained without and with intravenous contrast. CONTRAST: 15 mL MultiHance  COMPARISON: MRI of the head and IAC's 12/25/2012  FINDINGS: No acute infarct, hemorrhage, or mass lesion is present. A retrocerebellar cyst is stable. There is no associated enhancement or significant mass effect.  Flow is present in the major intracranial arteries. The globes and orbits are intact. The right  maxillary sinus is near completely opacified. A fluid level is present in the left maxillary sinus. There is diffuse mucosal disease throughout the ethmoid air cells, and bilateral sphenoid sinuses. The right frontal sinus is near completely opacified. The mastoid air cells are clear. The The postcontrast images demonstrate stable appearance of a developmental venous anomaly in the posterior left parietal lobe. There is no associated cavernous malformation.   IMPRESSION: 1. Stable retrocerebellar arachnoid cyst, unlikely to be of clinical consequence to the patient. 2. Stable left parietal  developmental venous anomaly. 3. Diffuse sinus disease. This could be related to the patient's headaches.   Family History: History reviewed. No pertinent family history.  Family Psychiatric History: states that she has an aunt and a cousin that suffer from anxiety. Denies any family history of suicides, drug addiction or alcoholism.  Social History: Patient is married she has a 39-year-old daughter. 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 She lives with her husband and daughter in [place]. History  Alcohol Use  . Yes    History  Drug Use Not on file    Social History   Social History  . Marital Status: Married    Spouse Name: N/A  . Number of Children: N/A  . Years of Education: N/A   Social History Main Topics  . Smoking status: Never Smoker   . Smokeless tobacco: None  . Alcohol Use: Yes  . Drug Use: None  . Sexual Activity: Not Asked        Hospital Course:    Patient is a 29 year old married Caucasian female who appears to be displaying somatic delusions of parasitosis. Patient has had a multitude of testing that has failed to explain her symptoms. Patient continues to seek medical treatment and testing in order to find out was causing the headaches and what are the things coming out of her body fluids.  Delusions of parasites psychosis: Patient was restarted on olanzapine which was titrated up to olanzapine 20 mg qhs. Patient is improving. She is less somatic. She has been attending groups. Not as distressed about her somatic concerns.   During the early part of the hospitalization the patient was in severe distress due to her preoccupation with physical symptoms. Nurses report her as crying all the time, coming to the nurses station complaining of blacking out. In one occasion endorses so her coming out of her room crawling as she complained of not being able to walk.  Patient had minimal response to  reassurance from nursing staff or from physicians.  Initially patient did not allow treating team to contact her family as she did not wanted them to influence our assessment.   Patient demanded to have an MRI of the spine in a lumbar puncture. She threatened to sue if those tests were not completed.  Neurology evaluated the patient and they did not recommended MRI of the spine or lumbar puncture and there was no clinical evidence indicating need for those test.  Headaches: Patient was started on Elavil 25 mg by mouth daily at bedtime. However this medication was discharged prior to discharge as patient complaining of sedation.  Asthma: Patient will be continued Qvar  Labs: Hemoglobin A1c, lipid panel and TSH are all within the normal limits.  Imaging: Brain MRI completed on 10/19  Compared with prior MRI and CT, no significant change. IMPRESSION: Negative exam. No acute intracranial findings.   Consultations: During her hospital stay the patient was follow-up by the neurology  service. They agree with psychiatric diagnosis of either delusional disorder or somatic symptoms disorder.  No abnormalities. Neurological examination. Neurology order brain MRI with and without contrast that showed no difference from prior MRIs.  Only abnormality was chronic sinusitis.  Collateral information: Patient's family husband and patient's mother ha been contacted. Family meeting held 0n 10/20. Met with patient and her family for about an hour along with the Education officer, museum. Family had a lot of questions about the diagnosis, the medications and side effects and the prognosis for the illness. We also talked about the possible consequences of continuing to take her daughter for testing.    On discharge the patient appeared less somatic. However she was still reporting having on and off neuropathic pain on her legs. The morning prior to discharge she reported having some abdominal pain and wondering if she  needed to be evaluated by her primary care provider. She seemed less preoccupied with the parasitetosis but she was still suspicious about having it as she was describing the nasal discharge as "weird looking".  There was no need for seclusion, restraints or forced medications. Towards the end of her hospitalization when patient was less preoccupied with her somatic complaints she started attending groups.  Follow-up: Patient will follow-up with [facility] for now however an appointment has been made with [facility] to see Dr. Jimmye Norman.  Discharge Vitals:   Blood pressure 105/67, pulse 120, temperature 98 F (36.7 C), temperature source Oral, resp. rate 20, height 6' 1"  (1.854 m), weight 72.576 kg (160 lb), last menstrual period 08/31/2015, SpO2 100 %. Body mass index is 21.11 kg/(m^2).  Lab Results:   Results for Tricia, Moran (MRN [id number]) as of 09/14/2015 14:46  Ref. Range 09/07/2015 17:42 09/08/2015 13:51  Sodium Latest Ref Range: 135-145 mmol/L 140   Potassium Latest Ref Range: 3.5-5.1 mmol/L 3.9   Chloride Latest Ref Range: 101-111 mmol/L 106   CO2 Latest Ref Range: 22-32 mmol/L 28   BUN Latest Ref Range: 6-20 mg/dL 9   Creatinine Latest Ref Range: 0.44-1.00 mg/dL 0.92   Calcium Latest Ref Range: 8.9-10.3 mg/dL 9.1   EGFR (Non-African Amer.) Latest Ref Range: >60 mL/min >60   EGFR (African American) Latest Ref Range: >60 mL/min >60   Glucose Latest Ref Range: 65-99 mg/dL 89   Anion gap Latest Ref Range: 5-15  6   Alkaline Phosphatase Latest Ref Range: 38-126 U/L 42   Albumin Latest Ref Range: 3.5-5.0 g/dL 4.4   AST Latest Ref Range: 15-41 U/L 13 (L)   ALT Latest Ref Range: 14-54 U/L 11 (L)   Total Protein Latest Ref Range: 6.5-8.1 g/dL 7.6   Total Bilirubin Latest Ref Range: 0.3-1.2 mg/dL 0.4   Cholesterol Latest Ref Range: 0-200 mg/dL  118  Triglycerides Latest Ref Range: <150 mg/dL  64  HDL Cholesterol Latest Ref Range: >40 mg/dL  55  LDL (calc)  Latest Ref Range: 0-99 mg/dL  50  VLDL Latest Ref Range: 0-40 mg/dL  13  Total CHOL/HDL Ratio Latest Units: RATIO  2.1  WBC Latest Ref Range: 3.6-11.0 K/uL 7.2   RBC Latest Ref Range: 3.80-5.20 MIL/uL 4.21   Hemoglobin Latest Ref Range: 12.0-16.0 g/dL 12.6   HCT Latest Ref Range: 35.0-47.0 % 37.6   MCV Latest Ref Range: 80.0-100.0 fL 89.4   MCH Latest Ref Range: 26.0-34.0 pg 29.9   MCHC Latest Ref Range: 32.0-36.0 g/dL 33.5   RDW Latest Ref Range: 11.5-14.5 % 13.7   Platelets Latest Ref Range: 150-440  K/uL 032   Salicylate Lvl Latest Ref Range: 2.8-30.0 mg/dL <4.0   Acetaminophen Latest Ref Range: 10-30 ug/mL <10 (L)   Hemoglobin A1C Latest Ref Range: 4.0-6.0 %  5.0  Preg Test, Ur Latest Ref Range: NEGATIVE  NEGATIVE   TSH Latest Ref Range: 0.350-4.500 uIU/mL  1.591  Alcohol, Ethyl (B) Latest Ref Range: <5 mg/dL <5    Results for Tricia, Moran (MRN [id number]) as of 09/14/2015 14:46  Ref. Range 09/06/2015 21:18  Appearance Latest Ref Range: CLEAR  CLEAR (A)  Bacteria, UA Latest Ref Range: NONE SEEN  NONE SEEN  Bilirubin Urine Latest Ref Range: NEGATIVE  NEGATIVE  Color, Urine Latest Ref Range: YELLOW  YELLOW (A)  Glucose Latest Ref Range: NEGATIVE mg/dL NEGATIVE  Hgb urine dipstick Latest Ref Range: NEGATIVE  NEGATIVE  Ketones, ur Latest Ref Range: NEGATIVE mg/dL NEGATIVE  Leukocytes, UA Latest Ref Range: NEGATIVE  NEGATIVE  Mucous Unknown PRESENT  Nitrite Latest Ref Range: NEGATIVE  NEGATIVE  pH Latest Ref Range: 5.0-8.0  7.0  Protein Latest Ref Range: NEGATIVE mg/dL NEGATIVE  RBC / HPF Latest Ref Range: 0-5 RBC/hpf 0-5  Specific Gravity, Urine Latest Ref Range: 1.005-1.030  1.017  Squamous Epithelial / LPF Latest Ref Range: NONE SEEN  0-5 (A)  WBC, UA Latest Ref Range: 0-5 WBC/hpf 0-5  Preg Test, Ur Latest Ref Range: NEGATIVE  NEGATIVE   Results for Tricia, Moran (MRN [id number]) as of 09/14/2015 14:46  Ref. Range 09/07/2015 17:19 09/07/2015 17:42  Alcohol, Ethyl (B) Latest  Ref Range: <5 mg/dL  <5  Amphetamines, Ur Screen Latest Ref Range: NONE DETECTED  NONE DETECTED   Barbiturates, Ur Screen Latest Ref Range: NONE DETECTED  NONE DETECTED   Benzodiazepine, Ur Scrn Latest Ref Range: NONE DETECTED  NONE DETECTED   Cocaine Metabolite,Ur [place] Latest Ref Range: NONE DETECTED  NONE DETECTED   Methadone Scn, Ur Latest Ref Range: NONE DETECTED  NONE DETECTED   MDMA (Ecstasy)Ur Screen Latest Ref Range: NONE DETECTED  NONE DETECTED   Cannabinoid 50 Ng, Ur Delta Latest Ref Range: NONE DETECTED  NONE DETECTED   Opiate, Ur Screen Latest Ref Range: NONE DETECTED  NONE DETECTED   Phencyclidine (PCP) Ur S Latest Ref Range: NONE DETECTED  NONE DETECTED   Tricyclic, Ur Screen Latest Ref Range: NONE DETECTED  NONE DETECTED     09-Sep-2015 EKG Normal sinus rhythm Normal ECG When compared with ECG of 26-Jul-2015 07:17, No significant change was found   CLINICAL DATA: With involuntary commitment. Psychotic behavior.  EXAM: MRI HEAD WITHOUT AND WITH CONTRAST  TECHNIQUE: Multiplanar, multiecho pulse sequences of the brain and surrounding structures were obtained without and with intravenous contrast.  CONTRAST: 66m MULTIHANCE GADOBENATE DIMEGLUMINE 529 MG/ML IV SOLN  COMPARISON: CT head 09/06/2015. MRI brain 12/25/2012.  FINDINGS: The patient had difficulty remaining motionless. Fast protocol was employed. Overall study diagnostic.  No evidence for acute infarction, hemorrhage, mass lesion, hydrocephalus, or extra-axial fluid. Normal cerebral volume. No white matter disease.  Flow voids are maintained throughout the carotid, basilar, and vertebral arteries. There are no areas of chronic hemorrhage.  Normal pituitary and cerebellar tonsils. Retrocerebellar cisterna magna, normal variant.  Post infusion, no abnormal enhancement of the brain or meninges. There is a moderate-sized venous angioma in the LEFT parieto-occipital white matter, incidental  finding.  There is moderate mucosal thickening throughout the fronto ethmoid, maxillary, and sphenoid sinuses, with a large retention cyst in the RIGHT maxillary sinus. Slight layering fluid in the sphenoid.  No mastoid  fluid or orbital findings. Extracranial soft tissues unremarkable.  Compared with prior MRI and CT, no significant change.  IMPRESSION: Negative exam. No acute intracranial findings.  Chronic sinusitis.  Physical Findings: AIMS: Facial and Oral Movements Muscles of Facial Expression: None, normal Lips and Perioral Area: None, normal Jaw: None, normal Tongue: None, normal,Extremity Movements Upper (arms, wrists, hands, fingers): None, normal Lower (legs, knees, ankles, toes): None, normal, Trunk Movements Neck, shoulders, hips: None, normal, Overall Severity Severity of abnormal movements (highest score from questions above): None, normal Incapacitation due to abnormal movements: None, normal Patient's awareness of abnormal movements (rate only patient's report): No Awareness, Dental Status Current problems with teeth and/or dentures?: No Does patient usually wear dentures?: No  CIWA:  CIWA-Ar Total: 0 COWS:          Medication List    TAKE these medications      Indication   OLANZapine 20 MG tablet  Commonly known as:  ZYPREXA  Take 1 tablet (20 mg total) by mouth at bedtime.  Notes to Patient:  Delusional disorder      QVAR 40 MCG/ACT inhaler  Generic drug:  beclomethasone  Inhale 2 puffs into the lungs 2 (two) times daily.  Notes to Patient:  asthma          Total Discharge Time: >30 inutes  Signed: Hildred Priest 09/15/2015, 12:25 PM

## 2015-09-14 NOTE — Plan of Care (Signed)
Problem: Ineffective individual coping Goal: STG: Patient will remain free from self harm Outcome: Progressing Pt safe on the unit at this time     

## 2015-09-15 NOTE — [facility] Group Notes (Signed)
[facility] Note   09/15/2015 10:52 AM  Participation Quality:  Patient did not attend group    Lulu RidingIngle, Omega Durante T, MSW, [employer]CSWA

## 2015-09-15 NOTE — Progress Notes (Signed)
Patient is pleasant and cooperative.  Denies SI/HI/AVH.  Denies depression.   Discharge instructions given, verbalized understanding.  Prescriptions and crisis card given.  Personal belongings returned.  Escorted off unit by this writer off unit to meet family to travel home.

## 2015-09-15 NOTE — Progress Notes (Signed)
  [facility] :  Will you be returning to the same living situation after discharge:  Yes,  home with her husband At discharge, do you have transportation home?: Yes,  patient's husband to pick up Do you have the ability to pay for your medications: Yes,  patient has insurance  Release of information consent forms completed and in the chart;  Patient's signature needed at discharge.  Patient to Follow up at: Follow-up Information    Follow up with [facility]. Go on 09/18/2015.   Why:  For follow-up care appt Monday 09/18/15 for assessment   Contact information:   [address] [place], [place] Ph [phone] Fax [phone]       Follow up with [facility].   Why:  For follow-up care appt will be scheduled but [facility]   Contact information:   [address] [place], [place] [address] Ph [phone] Fax [phone]       Patient denies SI/HI: Yes,  patient denies SI/HI    Safety Planning and Suicide Prevention discussed: Yes,  SPE discussed with paitent and her husband  Have you used any form of tobacco in the last 30 days? (Cigarettes, Smokeless Tobacco, Cigars, and/or Pipes): No  Has patient been referred to the Quitline?: N/A patient is not a smoker  Tricia Moran, Tricia Moran, MSW, LCSWA 09/15/2015, 4:17 PM

## 2015-09-15 NOTE — Progress Notes (Signed)
Recreation Therapy Notes  INPATIENT RECREATION TR PLAN  Patient Details Name: Daliah Chaudoin MRN: [id number] DOB: 1986/03/03 Today's Date: 09/15/2015  Rec Therapy Plan Is patient appropriate for Therapeutic Recreation?: Yes Treatment times per week: At least once a week TR Treatment/Interventions: 1:1 session, Group participation (Comment) (Appropriate participation in daily recreation therapy tx)  Discharge Criteria Pt will be discharged from therapy if:: Treatment goals are met, Discharged  Discharge Summary Short term goals set: See Care Plan Short term goals met: Adequate for discharge Progress toward goals comments: One-to-one attended Which groups?: Other (Comment) (Self-expression) One-to-one attended: Self-expression Reason goals not met: Patient d/c before goal could be met Therapeutic equipment acquired: N/A Reason patient discharged from therapy: Discharge from hospital Pt/family agrees with progress & goals achieved: Yes Date patient discharged from therapy: 09/15/15   Leonette Monarch, LRT/CTRS 09/15/2015, 4:32 PM

## 2015-09-15 NOTE — [facility] Suicide Risk Assessment (Signed)
[facility]   Demographic Factors:  Adolescent or young adult and Caucasian  Total Time spent with patient: 30 minutes   Psychiatric Specialty Exam: Physical Exam  ROS                                                         Have you used any form of tobacco in the last 30 days? (Cigarettes, Smokeless Tobacco, Cigars, and/or Pipes): No  Has this patient used any form of tobacco in the last 30 days? (Cigarettes, Smokeless Tobacco, Cigars, and/or Pipes) No  Mental Status Per Nursing Assessment::   On Admission:     Current Mental Status by Physician: Denies SI, HI or auditory or visual hallucinations. Denies hopelessness, helplessness or worthlessness. Patient continues to have somatic preoccupation but appears less delusional than at admission. Patient is much calmer and less anxious.  Loss Factors: Decline in physical health  Historical Factors: NA  Risk Reduction Factors:   Responsible for children under 28 years of age, Sense of responsibility to family, Employed, Living with another person, especially a relative and Positive social support  Continued Clinical Symptoms:  Severe Anxiety and/or Agitation  Cognitive Features That Contribute To Risk:  None    Suicide Risk:  Minimal: No identifiable suicidal ideation.  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 Sinusitis, chronic [J32.9] 09/14/2015  . IBS (irritable bowel syndrome) [K58.9] 09/14/2015  . 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 Go on 09/18/2015.   Why:  For follow-up care appt Monday 09/18/15 for assessment   Contact information:   [address] [place], [place] Ph [phone] Fax [phone]       Follow up with [facility].   Why:  For follow-up care appt will be scheduled but [facility]   Contact information:   [address] [place], [place] [address] Ph [phone] Fax [phone]       Is patient on multiple antipsychotic therapies at discharge:  No   Has Patient had three or more failed trials of antipsychotic monotherapy by history:  No  Recommended Plan for Multiple Antipsychotic Therapies: NA    Jimmy FootmanHernandez-Gonzalez,  Koreena Joost 09/15/2015, 12:23 PM

## 2015-09-15 NOTE — [facility] Group Notes (Signed)
[facility] Group Notes:  (Nursing/MHT/Case Management/Adjunct)  Date:  09/15/2015  Time:  5:54 AM  Type of Therapy:  Group Therapy  Participation Level:  Active  Participation Quality:  Appropriate and Attentive  Affect:  Appropriate  Cognitive:  Alert and Appropriate  Insight:  Appropriate and Good  Engagement in Group:  Engaged  Modes of Intervention:  Discussion  Summary of Progress/Problems:  Illona Bulman Joy Samarah Hogle 09/15/2015, 5:54 AM

## 2015-09-15 NOTE — Progress Notes (Signed)
D: Pt denies SI/HI/AVH. Pt is pleasant and cooperative. Pt stated she was doing much better. Pt observed earlier visiting with husband.   A: Pt was offered support and encouragement. Pt was given scheduled medications. Pt was encourage to attend groups. Q 15 minute checks were done for safety.   R:Pt attends groups and interacts well with peers and staff. Pt is taking medication. Pt has no complaints at this time .Pt receptive to treatment and safety maintained on unit.

## 2015-10-12 ENCOUNTER — Other Ambulatory Visit: Payer: Self-pay | Admitting: *Deleted

## 2015-10-12 DIAGNOSIS — R200 Anesthesia of skin: Secondary | ICD-10-CM

## 2015-11-02 ENCOUNTER — Ambulatory Visit: Admission: RE | Admit: 2015-11-02 | Payer: BC Managed Care – PPO | Source: Ambulatory Visit

## 2016-06-19 IMAGING — MR MR HEAD WO/W CM
8 of 10 series · 32 of 48 positions shown · IV contrast (15 ML MULTIHANCE)
Comparison: CT head 09/06/2015.  MRI brain 12/25/2012.

CLINICAL DATA: With involuntary commitment.  Psychotic behavior.

EXAM:
MRI HEAD WITHOUT AND WITH CONTRAST
TECHNIQUE: Multiplanar, multiecho pulse sequences of the brain and surrounding
structures were obtained without and with intravenous contrast.
CONTRAST:  15mL MULTIHANCE GADOBENATE DIMEGLUMINE 529 MG/ML IV SOLN

[Series 2: T1 · sagittal · 5.0mm · 0.36mm/px · 3 of 27 slices shown]
[im 1/27]
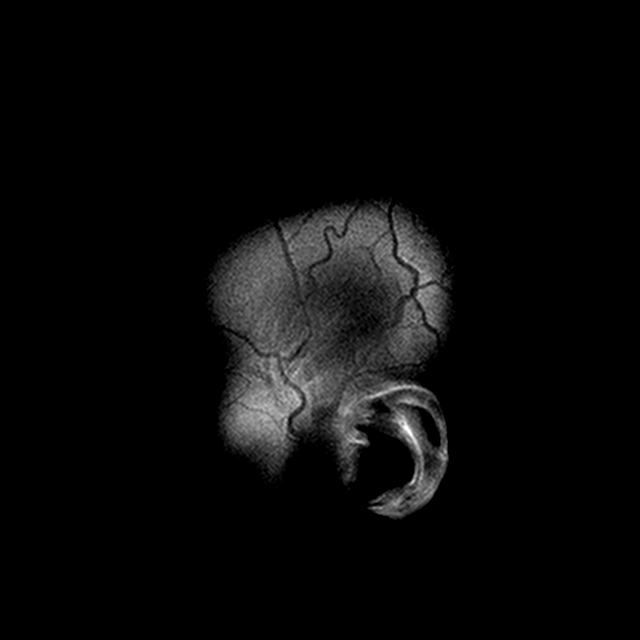
[im 14/27]
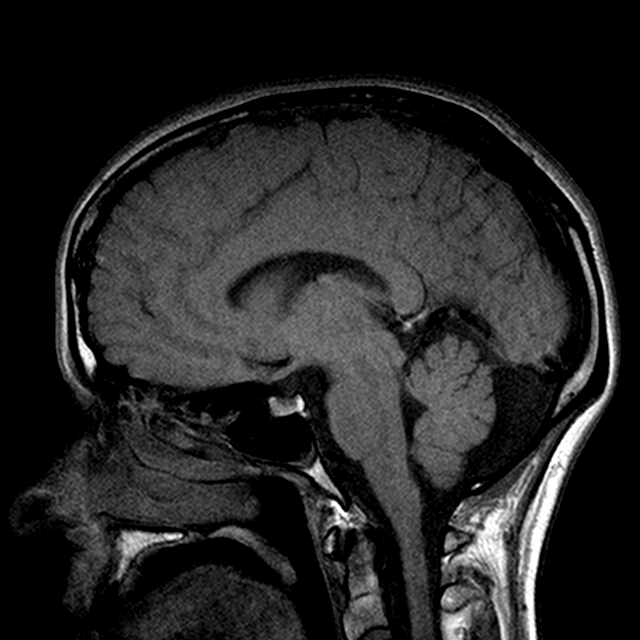
[im 27/27]
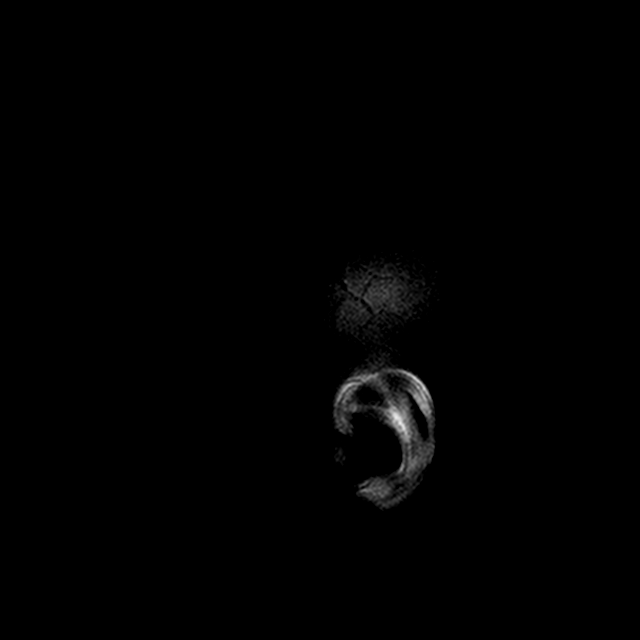

[Series 4: DWI · axial · 4.0mm · 0.94mm/px · z∈[-45,+130]mm · 6 of 45 slices shown (1 of 2)]
[im 1/45]
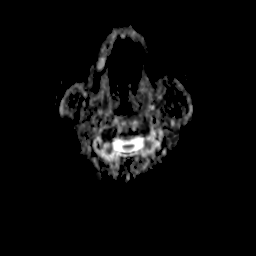
[im 9/45]
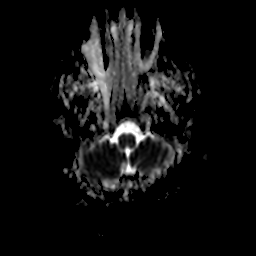
[im 18/45]
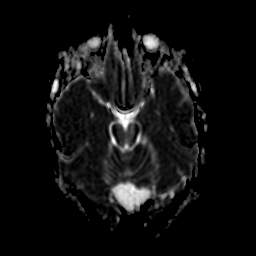
[im 27/45]
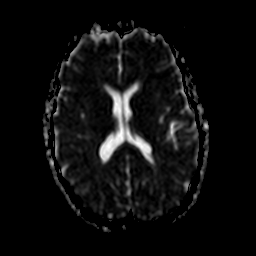
[im 36/45]
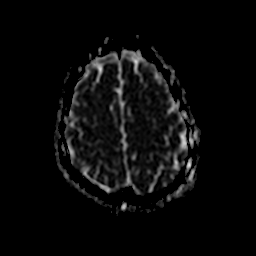
[im 45/45]
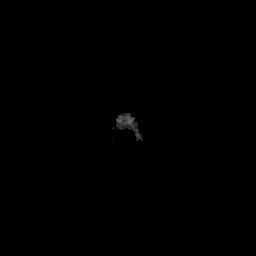

[Series 5: DWI · axial · 4.0mm · 0.94mm/px · z∈[-45,+130]mm · 6 of 45 slices shown (2 of 2)]
[im 1/45]
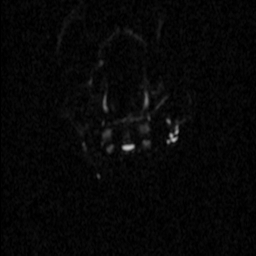
[im 9/45]
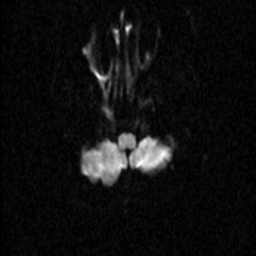
[im 18/45]
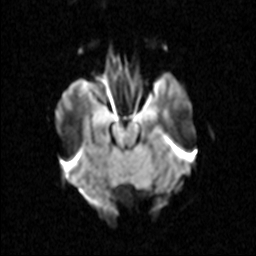
[im 27/45]
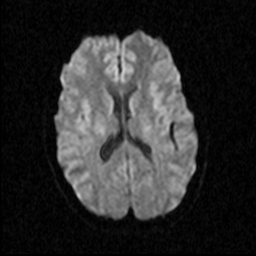
[im 36/45]
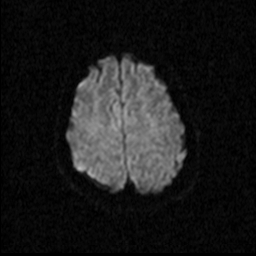
[im 45/45]
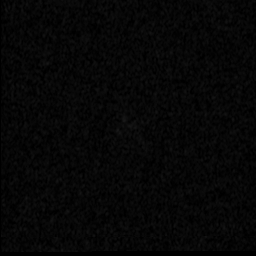

[Series 6: T2 · axial · 5.0mm · 0.68mm/px · z∈[-42,+126]mm · 3 of 27 slices shown (1 of 2)]
[im 1/27]
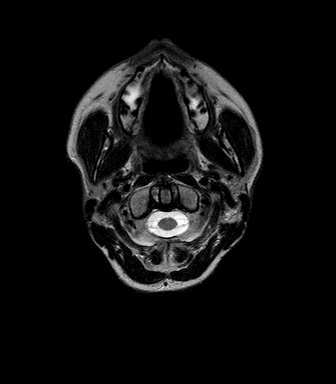
[im 14/27]
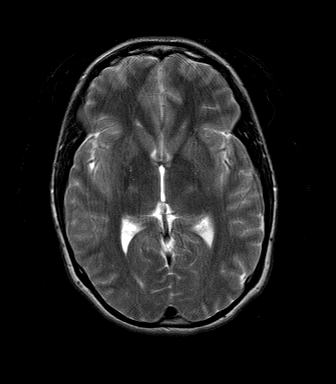
[im 27/27]
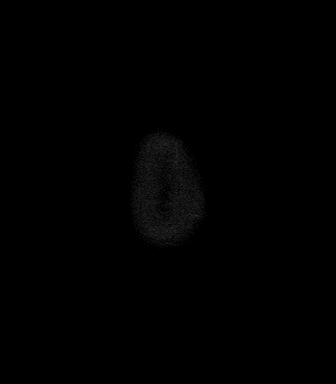

[Series 7: FLAIR · axial · 5.0mm · 0.90mm/px · z∈[-40,+129]mm · 3 of 27 slices shown]
[im 1/27]
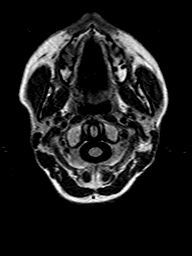
[im 14/27]
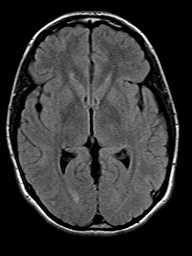
[im 27/27]
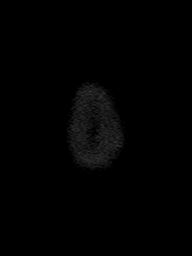

[Series 8: T2 · axial · 5.0mm · 0.45mm/px · z∈[-40,+128]mm · 3 of 27 slices shown (2 of 2)]
[im 1/27]
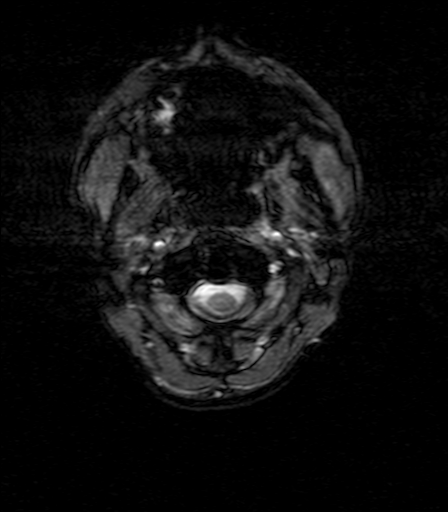
[im 14/27]
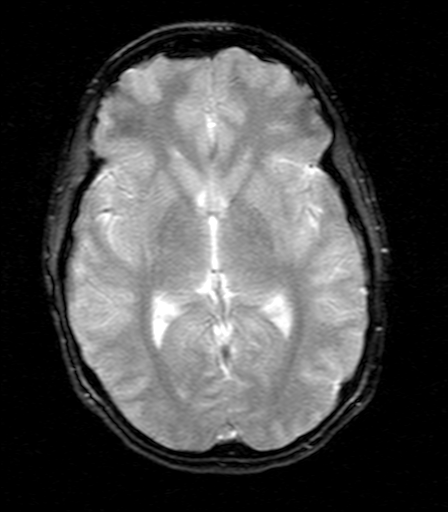
[im 27/27]
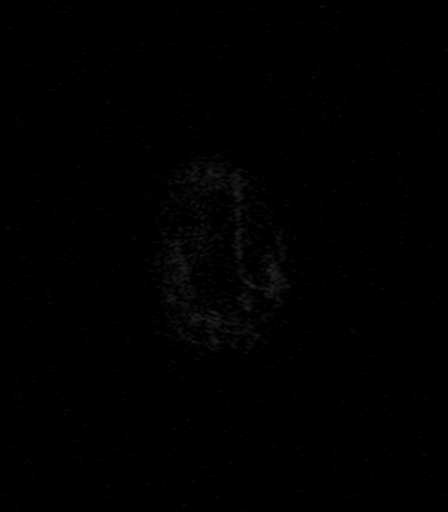

[Series 10: T2 post-contrast · coronal · 5.0mm · 0.51mm/px · 4 of 31 slices shown]
[im 1/31]
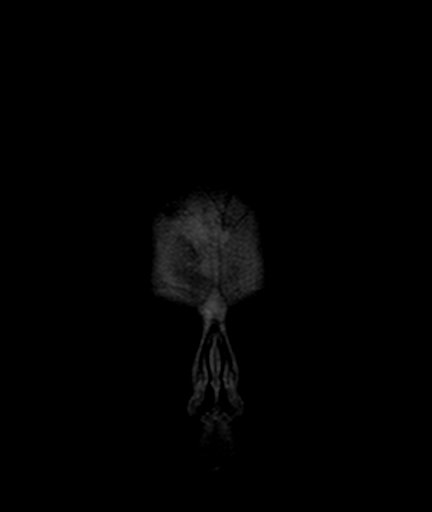
[im 11/31]
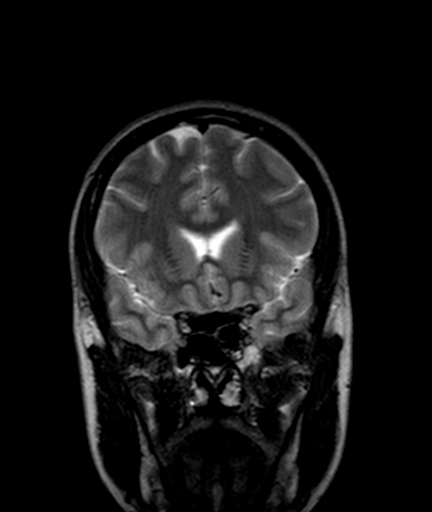
[im 21/31]
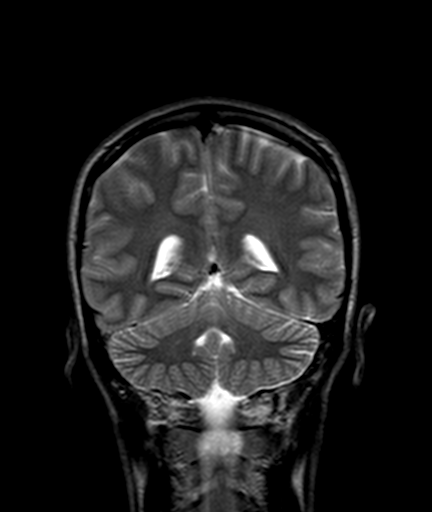
[im 31/31]
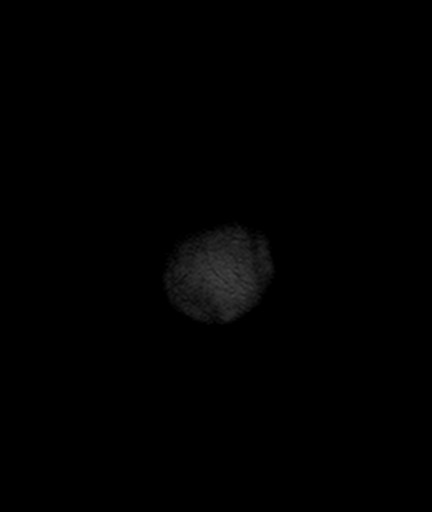

[Series 12: T1 post-contrast · coronal · 5.0mm · 0.49mm/px · 4 of 31 slices shown]
[im 1/31]
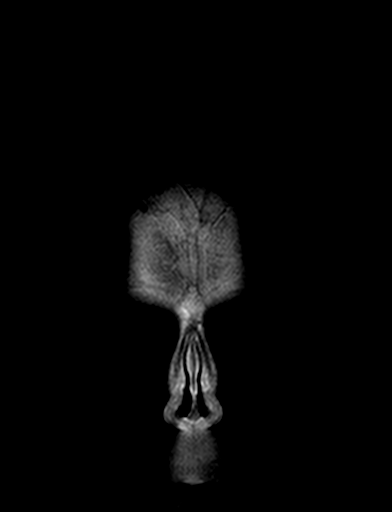
[im 11/31]
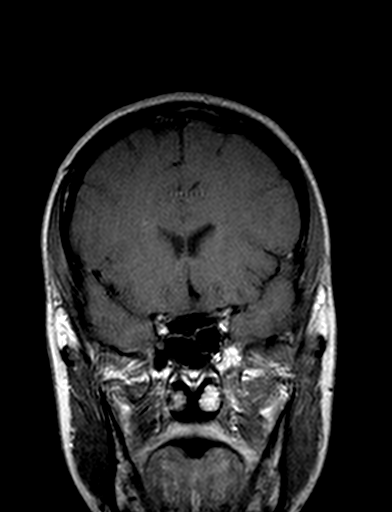
[im 21/31]
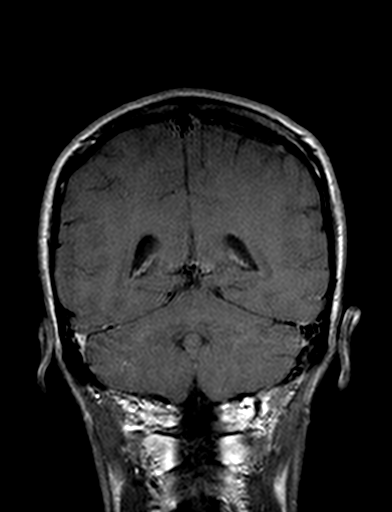
[im 31/31]
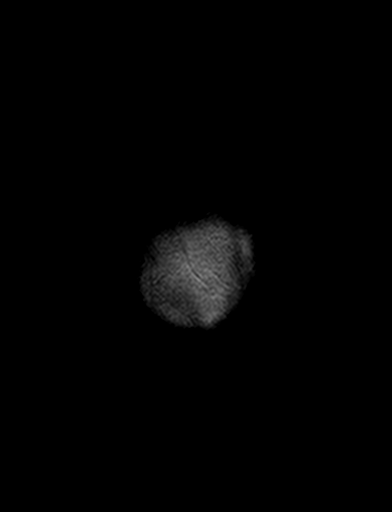

[32 of 48 positions shown; findings below may reference images not displayed]

FINDINGS: The patient had difficulty remaining motionless. Fast protocol was
employed. Overall study diagnostic.

No evidence for acute infarction, hemorrhage, mass lesion,
hydrocephalus, or extra-axial fluid. Normal cerebral volume. No
white matter disease.

Flow voids are maintained throughout the carotid, basilar, and
vertebral arteries. There are no areas of chronic hemorrhage.

Normal pituitary and cerebellar tonsils. Retrocerebellar cisterna
magna, normal variant.

Post infusion, no abnormal enhancement of the brain or meninges.
There is a moderate-sized venous angioma in the LEFT
parieto-occipital white matter, incidental finding.

There is moderate mucosal thickening throughout the fronto ethmoid,
maxillary, and sphenoid sinuses, with a large retention cyst in the
RIGHT maxillary sinus. Slight layering fluid in the sphenoid.

No mastoid fluid or orbital findings. Extracranial soft tissues
unremarkable.

Compared with prior MRI and CT, no significant change.
IMPRESSION: Negative exam.  No acute intracranial findings.

Chronic sinusitis.

## 2016-09-24 ENCOUNTER — Ambulatory Visit: Payer: Self-pay | Admitting: Physician Assistant

## 2016-09-25 ENCOUNTER — Ambulatory Visit: Payer: Self-pay | Admitting: Physician Assistant

## 2016-09-25 VITALS — BP 120/81 | HR 72 | Temp 98.3°F

## 2016-09-25 DIAGNOSIS — M25552 Pain in left hip: Secondary | ICD-10-CM

## 2016-09-25 MED ORDER — METHYLPREDNISOLONE 4 MG PO TBPK: ORAL_TABLET | ORAL | 0 refills | Status: DC

## 2016-09-25 NOTE — Progress Notes (Signed)
S: c/o left hip pain, was running at fast speed on treadmill, felt pain in hip, now hurts to walk, pain at outer edge of hip, doesn't feel like its dislocated, just inflamed, no numbness or tingling  O: vitals wnl, nad, left hip neg for bony tenderness, no pain on passive rom, intentional rom reproduces pain with internal rotation of hip and bring knee to chest, n/v intact, pt pronates when she walks  A: acute left hip pain  P: medrol dose pack, foam roller, exercises, ice, refer to sports med

## 2016-09-28 NOTE — Progress Notes (Deleted)
  Tawana ScaleZach Smith D.O. [facility] [address] [place], [place] [address] Phone: [phone] Subjective:    I'm seeing this patient by the request  of:  Linwood DibblesSusan Fischer PA   CC: Left hip pain   QIO:[id number]:Subjective  Tricia Moran is a 30 y.o. female coming in with complaint of left hip pain. Patient does like to workout and noticed that she was running on a treadmill. Felt pain in the hip almost immediately. Started having increasing pain with walking and some of her regular daily activities. Patient was seen by another provider 5 days ago. Was given a prednisone Dosepak and some mild exercises and we're is referred here for further evaluation.     No past medical history on file. Bipolar disease No past surgical history on file. Social History   Social History  . Marital status: Married    Spouse name: N/A  . Number of children: N/A  . Years of education: N/A   Social History Main Topics  . Smoking status: Never Smoker  . Smokeless tobacco: Not on file  . Alcohol use Yes  . Drug use: Unknown  . Sexual activity: Not on file   Other Topics Concern  . Not on file   Social History Narrative  . No narrative on file   No Known Allergies No family history on file.  Past medical history, social, surgical and family history all reviewed in electronic medical record.  No pertanent information unless stated regarding to the chief complaint.   Review of Systems:Review of systems updated and as accurate as of 09/28/16  No headache, visual changes, nausea, vomiting, diarrhea, constipation, dizziness, abdominal pain, skin rash, fevers, chills, night sweats, weight loss, swollen lymph nodes, body aches, joint swelling, muscle aches, chest pain, shortness of breath, mood changes.   Objective  There were no vitals taken for this visit. Systems examined below as of 09/28/16   General: No apparent distress alert and oriented x3 mood and affect normal, dressed appropriately.  HEENT:  Pupils equal, extraocular movements intact  Respiratory: Patient's speak in full sentences and does not appear short of breath  Cardiovascular: No lower extremity edema, non tender, no erythema  Skin: Warm dry intact with no signs of infection or rash on extremities or on axial skeleton.  Abdomen: Soft nontender  Neuro: Cranial nerves II through XII are intact, neurovascularly intact in all extremities with 2+ DTRs and 2+ pulses.  Lymph: No lymphadenopathy of posterior or anterior cervical chain or axillae bilaterally.  Gait normal with good balance and coordination.  MSK:  Non tender with full range of motion and good stability and symmetric strength and tone of shoulders, elbows, wrist, hip, knee and ankles bilaterally.     Impression and Recommendations:     This case required medical decision making of moderate complexity.      Note: This dictation was prepared with Dragon dictation along with smaller phrase technology. Any transcriptional errors that result from this process are unintentional.

## 2016-09-30 ENCOUNTER — Ambulatory Visit: Payer: BC Managed Care – PPO | Admitting: Family Medicine

## 2016-10-03 ENCOUNTER — Encounter: Payer: Self-pay | Admitting: Family Medicine

## 2016-10-03 ENCOUNTER — Ambulatory Visit (INDEPENDENT_AMBULATORY_CARE_PROVIDER_SITE_OTHER): Payer: Managed Care, Other (non HMO) | Admitting: Family Medicine

## 2016-10-03 DIAGNOSIS — M24852 Other specific joint derangements of left hip, not elsewhere classified: Secondary | ICD-10-CM | POA: Diagnosis not present

## 2016-10-03 MED ORDER — MELOXICAM 15 MG PO TABS: 15.0000 mg | ORAL_TABLET | Freq: Every day | ORAL | 0 refills | Status: DC

## 2016-10-03 NOTE — Progress Notes (Signed)
Tricia ScaleZach Mariapaula Moran D.O. [facility] [address] [place], [place] [address] Phone: [phone] Subjective:    I'm seeing this patient by the request  of:  Linwood DibblesSusan Fischer PA   CC: Left hip pain   XLK:[id number]:Subjective  Tricia GrewCourtney Moran is a 30 y.o. female coming in with complaint of left hip pain. Patient does like to workout and noticed that she was running on a treadmill. Felt pain in the hip almost immediately. Started having increasing pain with walking and some of her regular daily activities. Patient was seen by another provider 5 days ago. Was given a prednisone Dosepak and some mild exercises and we're is referred here for further evaluation. Patient did not take the medication. Has stopped working out as frequently. States that the pain is worse with going from a seated to standing position now. States they can also wake her up at night. Denies any radiation down the leg. Seems to be localized mostly over the lateral aspect of the hip. Denies any back pain with it. No weakness.     No past medical history on file. Bipolar disease No past surgical history on file. Social History   Social History  . Marital status: Married    Spouse name: N/A  . Number of children: N/A  . Years of education: N/A   Social History Main Topics  . Smoking status: Never Smoker  . Smokeless tobacco: None  . Alcohol use Yes  . Drug use: Unknown  . Sexual activity: Not Asked   Other Topics Concern  . None   Social History Narrative  . None   No Known Allergies No family history on file. No rheumatological history in the family  Past medical history, social, surgical and family history all reviewed in electronic medical record.  No pertanent information unless stated regarding to the chief complaint.   Review of Systems:Review of systems updated and as accurate as of 10/03/16  No headache, visual changes, nausea, vomiting, diarrhea, constipation, dizziness, abdominal pain, skin rash, fevers,  chills, night sweats, weight loss, swollen lymph nodes, body aches, joint swelling, muscle aches, chest pain, shortness of breath, mood changes.   Objective  Blood pressure 128/82, pulse 76, height 6\' 1"  (1.854 m), weight 175 lb (79.4 kg), SpO2 94 %. Systems examined below as of 10/03/16   General: No apparent distress alert and oriented x3 mood and affect normal, dressed appropriately.  HEENT: Pupils equal, extraocular movements intact  Respiratory: Patient's speak in full sentences and does not appear short of breath  Cardiovascular: No lower extremity edema, non tender, no erythema  Skin: Warm dry intact with no signs of infection or rash on extremities or on axial skeleton.  Abdomen: Soft nontender  Neuro: Cranial nerves II through XII are intact, neurovascularly intact in all extremities with 2+ DTRs and 2+ pulses.  Lymph: No lymphadenopathy of posterior or anterior cervical chain or axillae bilaterally.  Gait normal with good balance and coordination.  MSK:  Non tender with full range of motion and good stability and symmetric strength and tone of shoulders, elbows, wrist, knee and ankles bilaterally.   Hip: Left ROM IR: 25 Deg, ER: 45 Deg, Flexion: 120 Deg, Extension: 100 Deg, Abduction: 45 Deg, Adduction: 45 Deg Strength IR: 5/5, ER: 5/5, Flexion: 5/5, Extension: 5/5, Abduction: 4/5, Adduction: 4/5 Pelvic alignment unremarkable to inspection and palpation. Standing hip rotation and gait without trendelenburg sign / unsteadiness. Greater trochanter without tenderness to palpation. Tenderness over the anterior lateral aspect of the  hip. Tightness noted with Pearlean BrownieFaber No SI joint tenderness and normal minimal SI movement. Contralateral hip unremarkable  Procedure note 97110; 15 minutes spent for Therapeutic exercises as stated in above notes.  This included exercises focusing on stretching, strengthening, with significant focus on eccentric aspects.  Hip strengthening exercises which  included:  Pelvic tilt/bracing to help with proper recruitment of the lower abs and pelvic floor muscles  Glute strengthening to properly contract glutes without over-engaging low back and hamstrings - prone hip extension and glute bridge exercises Proper stretching techniques to increase effectiveness for the hip flexors, groin, quads, piriformic and low back when appropriate    Proper technique shown and discussed handout in great detail with ATC.  All questions were discussed and answered.     Impression and Recommendations:     This case required medical decision making of moderate complexity.      Note: This dictation was prepared with Dragon dictation along with smaller phrase technology. Any transcriptional errors that result from this process are unintentional.

## 2016-10-03 NOTE — Assessment & Plan Note (Signed)
Oral anti-inflammatories given, home exercises given, discussed which activities doing which ones to avoid. Follow-up again in 4 weeks. Worsening symptoms we'll consider x-ray as well as formal physical therapy. Differential includes lumbar pathology but highly unlikely. From oral acetabular impingement could be a possibility but full range of motion

## 2016-10-03 NOTE — Patient Instructions (Signed)
God to meet you  You have snapping hip syndrome.  Meloxicam daily for 10 days.  Ice 20 minutes 2 times daily. Usually after activity and before bed. I am ok with running but would rather have you do biking for next 2 weeks.  Exercises 3 times a week.  See me again in 4 weeks to make sure you are all the way better

## 2016-10-07 ENCOUNTER — Ambulatory Visit: Payer: BC Managed Care – PPO | Admitting: Family Medicine

## 2016-10-09 ENCOUNTER — Ambulatory Visit: Payer: Self-pay | Admitting: Physician Assistant

## 2016-10-09 ENCOUNTER — Encounter: Payer: Self-pay | Admitting: Physician Assistant

## 2016-10-09 VITALS — BP 126/88 | HR 94 | Temp 98.6°F

## 2016-10-09 DIAGNOSIS — F411 Generalized anxiety disorder: Secondary | ICD-10-CM

## 2016-10-09 NOTE — Progress Notes (Signed)
S: multiple complaints, 1. Chest pressure with abdominal bloating and ?fluid in legs, had cardiac workup done at pcp, echo with ?echo stress and did not find anything wrong with her heart, was having similar sx at that time, is able to run on the treadmill and exercise without cp or sob;  2. Numbness and tingling on her face, moves to other side occasionally, had 3 mris done last year because of this  3. Gets colds and coughs often 4. Was told it was bc she has anxiety that she's having all of these sx but her "intuition" tells her otherwise, 5. ?if she needs to see infectious disease bc when they traveled to [place] a few years ago she woke up with a rash on her face and she and her daughter stayed sick that winter, pt's pcp is in [place]  O: vitals wnl, nad, ent wnl, neck supple no lymph, lungs c ta , cv rrr, no pedal or peripheral edema noted, n/v intact, pt is nervous and jumps from one subject to another  A: anxiety  P: explained to pt that the clinic is for acute care type problems, if chest pain returns she should go to the ER, advised her to see her pcp as they are aware of all of her problems and testing that has been done, do not see anything wrong with her today, note hx of delusional disorder

## 2016-10-21 ENCOUNTER — Ambulatory Visit: Payer: Self-pay | Admitting: Physician Assistant

## 2016-10-21 ENCOUNTER — Encounter: Payer: Self-pay | Admitting: Physician Assistant

## 2016-10-21 VITALS — BP 123/89 | HR 102 | Temp 98.5°F

## 2016-10-21 DIAGNOSIS — B349 Viral infection, unspecified: Secondary | ICD-10-CM

## 2016-10-21 MED ORDER — PSEUDOEPH-BROMPHEN-DM 30-2-10 MG/5ML PO SYRP: 5.0000 mL | ORAL_SOLUTION | Freq: Four times a day (QID) | ORAL | 0 refills | Status: DC | PRN

## 2016-10-21 NOTE — Progress Notes (Signed)
   Subjective:URI    Patient ID: Tricia Moran, female    DOB: 1986-09-11, 30 y.o.   MRN: [id number]  HPI  Patient c/o uri s/s consisitng of nasal congestion,sore throat, and cough. Patient request Flu test since she did not take shot this season.   Review of Systems    Negative except for compliant. Objective:   Physical Exam Bilateral maxillary guarding, edematous nasal turbinates, and post nasal drainage.Neck supple, Lungs CTA, and Heart RRR.       Assessment & Plan:Viral Illness  Advised Tamiflu would not be effective due to the length of her compliant. Patient states would still like test to r/o exposing daughter to flu. Prescription for Bromfed -DM written/

## 2016-10-21 NOTE — Addendum Note (Signed)
Addended by: Joni ReiningSMITH, Sarae Nicholes K on: 10/21/2016 10:53 AM   Modules accepted: Orders

## 2016-10-23 ENCOUNTER — Ambulatory Visit: Payer: Self-pay | Admitting: Family Medicine

## 2022-11-12 ENCOUNTER — Emergency Department: Payer: BC Managed Care – PPO

## 2022-11-12 ENCOUNTER — Encounter: Payer: Self-pay | Admitting: Emergency Medicine

## 2022-11-12 DIAGNOSIS — J101 Influenza due to other identified influenza virus with other respiratory manifestations: Secondary | ICD-10-CM | POA: Diagnosis not present

## 2022-11-12 DIAGNOSIS — R059 Cough, unspecified: Secondary | ICD-10-CM | POA: Diagnosis present

## 2022-11-12 DIAGNOSIS — Z1152 Encounter for screening for COVID-19: Secondary | ICD-10-CM | POA: Insufficient documentation

## 2022-11-12 LAB — RESP PANEL BY RT-PCR (RSV, FLU A&B, COVID)  RVPGX2
Influenza A by PCR: POSITIVE — AB
Influenza B by PCR: NEGATIVE
Resp Syncytial Virus by PCR: NEGATIVE
SARS Coronavirus 2 by RT PCR: NEGATIVE

## 2022-11-12 LAB — GROUP A STREP BY PCR: Group A Strep by PCR: NOT DETECTED

## 2022-11-12 NOTE — ED Triage Notes (Signed)
Pt presents via POV with complaints of cough for the last week. She was tested yesterday for COVID/Flu which was negative. She notes just "not feeling well" and felt "hot" after working out at [employer]. Denies CP or SOB.

## 2022-11-12 NOTE — ED Notes (Signed)
Charline Bills RN and Kathyrn Sheriff RN notified that patient began having left arm pain shooting into shoulder and chest that started while in xray, also started feeling dizzy and SOB.

## 2022-11-13 ENCOUNTER — Emergency Department
Admission: EM | Admit: 2022-11-13 | Discharge: 2022-11-13 | Disposition: A | Discharge status: Home / Self Care | Payer: BC Managed Care – PPO | Attending: Emergency Medicine | Admitting: Emergency Medicine

## 2022-11-13 DIAGNOSIS — J101 Influenza due to other identified influenza virus with other respiratory manifestations: Secondary | ICD-10-CM

## 2022-11-13 NOTE — ED Provider Notes (Signed)
[facility] Provider Note    Event Date/Time   First MD Initiated Contact with Patient 11/13/22 0130     (approximate)   History   Cough   HPI  Tricia Moran is a 36 y.o. female with no significant past medical history who comes ED complaining of cough congestion and fatigue for the past week.  She previously went to her doctor, had a COVID and flu test which were negative, but symptoms persist.  Today she went to the gym and afterwards she felt very fatigued.  Denies chest pain or shortness of breath, no vomiting diarrhea.  Eating okay.  No abdominal pain.  No syncope.     Physical Exam   Triage Vital Signs: ED Triage Vitals  Enc Vitals Group     BP 11/12/22 2234 (!) 147/98     Pulse Rate 11/12/22 2234 (!) 101     Resp 11/12/22 2234 20     Temp 11/12/22 2234 98.3 F (36.8 C)     Temp Source 11/12/22 2234 Oral     SpO2 11/12/22 2234 100 %     Weight 11/12/22 2237 172 lb 9.9 oz (78.3 kg)     Height 11/12/22 2237 6\' 1"  (1.854 m)     Head Circumference --      Peak Flow --      Pain Score 11/12/22 2235 0     Pain Loc --      Pain Edu? --      Excl. in GC? --     Most recent vital signs: Vitals:   11/12/22 2234  BP: (!) 147/98  Pulse: (!) 101  Resp: 20  Temp: 98.3 F (36.8 C)  SpO2: 100%    General: Awake, no distress.  CV:  Good peripheral perfusion.  Resp:  Normal effort.  Clear to auscultation bilaterally Abd:  No distention.  Other:  Moving all extremities, sitting upright   ED Results / Procedures / Treatments   Labs (all labs ordered are listed, but only abnormal results are displayed) Labs Reviewed  RESP PANEL BY RT-PCR (RSV, FLU A&B, COVID)  [id number] - Abnormal; Notable for the following components:      Result Value   Influenza A by PCR POSITIVE (*)    All other components within normal limits  GROUP A STREP BY PCR     RADIOLOGY CT chest interpreted by me, no consolidation.  Radiology report reviewed noting nodular  opacities and possible cavitary lesion, however she has no symptoms of TB or risk for abscess.  Flu test is positive explaining her symptoms.   PROCEDURES:  Procedures   MEDICATIONS ORDERED IN ED: Medications - No data to display   IMPRESSION / MDM / ASSESSMENT AND PLAN / ED COURSE  I reviewed the triage vital signs and the nursing notes.                              Differential diagnosis includes, but is not limited to, pneumonia, strep throat, COVID, RSV  Patient presents with URI symptoms for the past week.  Low-grade fever at home.  She is nontoxic, vital signs unremarkable on my exam.  Chest x-ray to me is reassuring.  She is influenza A positive explaining the symptoms.  NSAIDs, PCP follow-up.     FINAL CLINICAL IMPRESSION(S) / ED DIAGNOSES   Final diagnoses:  Influenza A     Rx / DC Orders  ED Discharge Orders     None        Note:  This document was prepared using Dragon voice recognition software and may include unintentional dictation errors.   Sharman Cheek, MD 11/13/22 [phone]

## 2022-11-13 NOTE — ED Notes (Signed)
ED Provider at bedside. 

## 2022-11-13 NOTE — ED Notes (Signed)
Pt brought to hallway 13, this RN now assuming care.

## 2022-11-13 NOTE — ED Notes (Signed)
Signing pad not available. Pt verbalized understanding of DC instructions. 

## 2023-02-03 ENCOUNTER — Emergency Department (EMERGENCY_DEPARTMENT_HOSPITAL)
Admission: EM | Admit: 2023-02-03 | Discharge: 2023-02-04 | Disposition: A | Discharge status: Other Acute Inpatient Hospital | Payer: BC Managed Care – PPO | Source: Home / Self Care | Attending: Emergency Medicine | Admitting: Emergency Medicine

## 2023-02-03 DIAGNOSIS — F29 Unspecified psychosis not due to a substance or known physiological condition: Secondary | ICD-10-CM

## 2023-02-03 DIAGNOSIS — F22 Delusional disorders: Secondary | ICD-10-CM | POA: Diagnosis present

## 2023-02-03 DIAGNOSIS — Z1152 Encounter for screening for COVID-19: Secondary | ICD-10-CM | POA: Insufficient documentation

## 2023-02-03 DIAGNOSIS — F4325 Adjustment disorder with mixed disturbance of emotions and conduct: Secondary | ICD-10-CM | POA: Diagnosis not present

## 2023-02-03 LAB — CBC WITH DIFFERENTIAL/PLATELET
Abs Immature Granulocytes: 0.02 10*3/uL (ref 0.00–0.07)
Basophils Absolute: 0 10*3/uL (ref 0.0–0.1)
Basophils Relative: 1 %
Eosinophils Absolute: 0.1 10*3/uL (ref 0.0–0.5)
Eosinophils Relative: 1 %
HCT: 38.1 % (ref 36.0–46.0)
Hemoglobin: 13.1 g/dL (ref 12.0–15.0)
Immature Granulocytes: 1 %
Lymphocytes Relative: 27 %
Lymphs Abs: 1.2 10*3/uL (ref 0.7–4.0)
MCH: 30.9 pg (ref 26.0–34.0)
MCHC: 34.4 g/dL (ref 30.0–36.0)
MCV: 89.9 fL (ref 80.0–100.0)
Monocytes Absolute: 0.4 10*3/uL (ref 0.1–1.0)
Monocytes Relative: 9 %
Neutro Abs: 2.6 10*3/uL (ref 1.7–7.7)
Neutrophils Relative %: 61 %
Platelets: 286 10*3/uL (ref 150–400)
RBC: 4.24 MIL/uL (ref 3.87–5.11)
RDW: 13.2 % (ref 11.5–15.5)
WBC: 4.3 10*3/uL (ref 4.0–10.5)
nRBC: 0 % (ref 0.0–0.2)

## 2023-02-03 LAB — SALICYLATE LEVEL: Salicylate Lvl: <7 mg/dL — ABNORMAL LOW (ref 7.0–30.0)

## 2023-02-03 LAB — COMPREHENSIVE METABOLIC PANEL
ALT: 20 U/L (ref 0–44)
AST: 27 U/L (ref 15–41)
Albumin: 4.6 g/dL (ref 3.5–5.0)
Alkaline Phosphatase: 46 U/L (ref 38–126)
Anion gap: 19 — ABNORMAL HIGH (ref 5–15)
BUN: 14 mg/dL (ref 6–20)
CO2: 19 mmol/L — ABNORMAL LOW (ref 22–32)
Calcium: 9.6 mg/dL (ref 8.9–10.3)
Chloride: 101 mmol/L (ref 98–111)
Creatinine, Ser: 0.85 mg/dL (ref 0.44–1.00)
GFR, Estimated: >60 mL/min (ref >60)
Glucose, Bld: 109 mg/dL — ABNORMAL HIGH (ref 70–99)
Potassium: 3.2 mmol/L — ABNORMAL LOW (ref 3.5–5.1)
Sodium: 139 mmol/L (ref 135–145)
Total Bilirubin: 1.1 mg/dL (ref 0.3–1.2)
Total Protein: 8 g/dL (ref 6.5–8.1)

## 2023-02-03 LAB — ETHANOL: Alcohol, Ethyl (B): <10 mg/dL (ref <10)

## 2023-02-03 LAB — ACETAMINOPHEN LEVEL: Acetaminophen (Tylenol), Serum: <10 ug/mL — ABNORMAL LOW (ref 10–30)

## 2023-02-03 MED ORDER — HALOPERIDOL 5 MG PO TABS: 5.0000 mg | ORAL_TABLET | Freq: Two times a day (BID) | ORAL | Status: DC | PRN

## 2023-02-03 MED ORDER — DIPHENHYDRAMINE HCL 25 MG PO CAPS: 50.0000 mg | ORAL_CAPSULE | Freq: Two times a day (BID) | ORAL | Status: DC | PRN

## 2023-02-03 MED ORDER — LORAZEPAM 2 MG PO TABS: 2.0000 mg | ORAL_TABLET | Freq: Two times a day (BID) | ORAL | Status: DC | PRN

## 2023-02-03 MED ORDER — DIPHENHYDRAMINE HCL 50 MG/ML IJ SOLN: 50.0000 mg | Freq: Two times a day (BID) | INTRAMUSCULAR | Status: DC | PRN

## 2023-02-03 MED ORDER — HALOPERIDOL LACTATE 5 MG/ML IJ SOLN: 5.0000 mg | Freq: Two times a day (BID) | INTRAMUSCULAR | Status: DC | PRN

## 2023-02-03 MED ORDER — LORAZEPAM 2 MG/ML IJ SOLN
2.0000 mg | Freq: Once | INTRAMUSCULAR | Status: AC
  Administered 2023-02-03: 2 mg via INTRAMUSCULAR
  Filled 2023-02-03: qty 1

## 2023-02-03 MED ORDER — LORAZEPAM 2 MG/ML IJ SOLN: 2.0000 mg | Freq: Two times a day (BID) | INTRAMUSCULAR | Status: DC | PRN

## 2023-02-03 MED ORDER — DROPERIDOL 2.5 MG/ML IJ SOLN
5.0000 mg | Freq: Once | INTRAMUSCULAR | Status: AC
  Administered 2023-02-03: 5 mg via INTRAMUSCULAR

## 2023-02-03 NOTE — ED Notes (Signed)
IVC PENDING  CONSULT ?

## 2023-02-03 NOTE — ED Notes (Signed)
Pt currently in restraint chair. Repeating "I don't want to die, I want to live." Pt not wanting to receive care. Pt states that staff IS "going to kill her". Pt very manic, requesting to be removed from the chair.

## 2023-02-03 NOTE — ED Provider Notes (Signed)
Emergency Medicine Observation Re-evaluation Note  Tricia Moran is a 37 y.o. female, seen on rounds today.  Pt initially presented to the ED for complaints of IVC Currently, the patient is sleeping comfortably.  Physical Exam  BP 102/76   Pulse 96   Temp 97.8 F (36.6 C) (Oral)   Resp 20   Ht '6\' 1"'$  (1.854 m)   Wt 78.3 kg   SpO2 96%   BMI 22.77 kg/m  Physical Exam General: sleeping, no distress Lungs: no increased wob Psych: patient is sleeping, did not awaken to limit agitation  ED Course / MDM  EKG:   I have reviewed the labs performed to date as well as medications administered while in observation.  Recent changes in the last 24 hours include patient presented from [place], initially came voluntarily but then was placed under IVC by police.  She required sedation for agitation on arrival.  Psychiatry has attempted to evaluate her but she was sleeping so has not been fully evaluated by psych yet..  Plan  Current plan is for psych evaluation, patient remains under IVC.    Rada Hay, MD 02/03/23 2352

## 2023-02-03 NOTE — ED Notes (Signed)
Pt dressed out by this RN, Casey Burkitt, and ED [employer].   Pt clothing: Pink shirt Gray bra burgundy underwear White shorts Pair of white socks Pair of white shoes

## 2023-02-03 NOTE — ED Triage Notes (Signed)
Pt presents to the ED via [employer] due to IVC. Pt is in a manic state asking for her mother to help provide additional information. [facility] sent patient over due to delusions and being paranoid. Pt is stating that [employer] is a robot and that they are not real. That [employer] killed her family. Pt is not cooperative and being aggressive. Pt is   Pt was placed in restraint chair

## 2023-02-03 NOTE — ED Provider Notes (Signed)
[facility] Provider Note    Event Date/Time   First MD Initiated Contact with Patient 02/03/23 1412     (approximate)   History   Chief Complaint IVC   HPI  Tricia Moran is a 37 y.o. female with past medical history of IBS and delusional disorder who presents to the ED for psychiatric evaluation.  Patient had reportedly presented to [facility] voluntarily earlier today, was found to be paranoid with delusions of parasitosis.  They had attempted to arrange for voluntary admission, but patient was unwilling to move forward with this and she was subsequently placed under IVC and brought to the ED.  She was brought to the ED in police custody, stating that police were robots and had killed her family.  Patient increasingly agitated and aggressive in triage, repeatedly stating that we are going to kill her.     Physical Exam   Triage Vital Signs: ED Triage Vitals [02/03/23 1358]  Enc Vitals Group     BP      Pulse      Resp      Temp      Temp src      SpO2      Weight 172 lb 9.9 oz (78.3 kg)     Height '6\' 1"'$  (1.854 m)     Head Circumference      Peak Flow      Pain Score 0     Pain Loc      Pain Edu?      Excl. in [place]?     Most recent vital signs: Vitals:   02/03/23 1517  BP: 102/76  Pulse: 96  Resp: 20  Temp: 97.8 F (36.6 C)  SpO2: 96%    Constitutional: Alert and oriented. Eyes: Conjunctivae are normal. Head: Atraumatic. Nose: No congestion/rhinnorhea. Mouth/Throat: Mucous membranes are moist.  Cardiovascular: Normal rate, regular rhythm. Grossly normal heart sounds.  2+ radial pulses bilaterally. Respiratory: Normal respiratory effort.  No retractions. Lungs CTAB. Gastrointestinal: Soft and nontender. No distention. Musculoskeletal: No lower extremity tenderness nor edema.  Neurologic: Agitated and aggressive with pressured speech. No gross focal neurologic deficits are appreciated.    ED Results / Procedures / Treatments    Labs (all labs ordered are listed, but only abnormal results are displayed) Labs Reviewed  ACETAMINOPHEN LEVEL - Abnormal; Notable for the following components:      Result Value   Acetaminophen (Tylenol), Serum <10 (*)    All other components within normal limits  SALICYLATE LEVEL - Abnormal; Notable for the following components:   Salicylate Lvl Q000111Q (*)    All other components within normal limits  ETHANOL  CBC WITH DIFFERENTIAL/PLATELET  CBC WITH DIFFERENTIAL/PLATELET  COMPREHENSIVE METABOLIC PANEL  URINE DRUG SCREEN, QUALITATIVE ([facility] ONLY)  POC URINE PREG, ED   PROCEDURES:  Critical Care performed: No  Procedures   MEDICATIONS ORDERED IN ED: Medications  droperidol (INAPSINE) 2.5 MG/ML injection 5 mg (5 mg Intramuscular Given 02/03/23 1443)  LORazepam (ATIVAN) injection 2 mg (2 mg Intramuscular Given 02/03/23 1443)     IMPRESSION / MDM / ASSESSMENT AND PLAN / ED COURSE  I reviewed the triage vital signs and the nursing notes.                              37 y.o. female with past medical history of IBS and delusional disorder who presents to the ED for psychiatric evaluation after  being placed under IVC at [facility] for increasing delusions.  Patient's presentation is most consistent with acute presentation with potential threat to life or bodily function.  Differential diagnosis includes, but is not limited to, psychosis, depression, anxiety, suicidal ideation, substance abuse.  Patient agitated and aggressive at the time of my evaluation, had already been placed in a restraint chair.  She becomes increasingly agitated as I attempt to evaluate her, not allowing for examination or for staff to obtain vital signs.  In order to ensure safety of both patient and staff, she was medicated with IM droperidol and Ativan.  We will have psychiatry evaluate the patient, screening labs are pending at this time.  Patient turned over to oncoming provider pending screening lab results  as well as psychiatric evaluation.  The patient has been placed in psychiatric observation due to the need to provide a safe environment for the patient while obtaining psychiatric consultation and evaluation, as well as ongoing medical and medication management to treat the patient's condition.  The patient has been placed under full IVC at this time.      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   Blake Divine, MD 02/03/23 [phone]

## 2023-02-03 NOTE — Consult Note (Signed)
1645: The Psych Team received a consult due to psychosis. Patient was brought to the emergency room by [employer] under Involuntarily Committed (IVC) by [facility] due to paranoid delusions. Attempted to consult on patient. She was sleeping soundly, on approach. Rise and fall of chest observed. Patient required violent physical restraints due to agitation and aggressive behaviors. She was ultimately medicated with IM droperidol and IM Ativan. The Psych Team will re-attempt to assess when patient is awake and able to participate.  Elma Limas H. Richardson Landry, NP.  02/03/2023 1726

## 2023-02-03 NOTE — ED Notes (Deleted)
VOL/  PENDING  CONSULT 

## 2023-02-03 NOTE — BH Assessment (Signed)
Psych Team not able to wake patient for assessment. Patient was given IM due to agitation upon arrival. Will interview later this evening when patient is awake and able to participate.

## 2023-02-03 NOTE — ED Notes (Signed)
IVC  from  [place]

## 2023-02-04 ENCOUNTER — Other Ambulatory Visit: Payer: Self-pay

## 2023-02-04 ENCOUNTER — Inpatient Hospital Stay
Admission: AD | Admit: 2023-02-04 | Discharge: 2023-02-11 | DRG: 882 | Disposition: A | Discharge status: Home / Self Care | Payer: BC Managed Care – PPO | Source: Intra-hospital | Attending: Psychiatry | Admitting: Psychiatry

## 2023-02-04 ENCOUNTER — Inpatient Hospital Stay: Admit: 2023-02-04 | Payer: Self-pay | Admitting: Psychiatry

## 2023-02-04 DIAGNOSIS — F419 Anxiety disorder, unspecified: Secondary | ICD-10-CM | POA: Diagnosis present

## 2023-02-04 DIAGNOSIS — Z79899 Other long term (current) drug therapy: Secondary | ICD-10-CM

## 2023-02-04 DIAGNOSIS — Z20822 Contact with and (suspected) exposure to covid-19: Secondary | ICD-10-CM | POA: Diagnosis present

## 2023-02-04 DIAGNOSIS — F4325 Adjustment disorder with mixed disturbance of emotions and conduct: Principal | ICD-10-CM | POA: Insufficient documentation

## 2023-02-04 DIAGNOSIS — F22 Delusional disorders: Secondary | ICD-10-CM | POA: Diagnosis present

## 2023-02-04 LAB — URINE DRUG SCREEN, QUALITATIVE (ARMC ONLY)
Amphetamines, Ur Screen: NOT DETECTED
Barbiturates, Ur Screen: NOT DETECTED
Benzodiazepine, Ur Scrn: POSITIVE — AB
Cannabinoid 50 Ng, Ur ~~LOC~~: NOT DETECTED
Cocaine Metabolite,Ur ~~LOC~~: NOT DETECTED
MDMA (Ecstasy)Ur Screen: NOT DETECTED
Methadone Scn, Ur: NOT DETECTED
Opiate, Ur Screen: NOT DETECTED
Phencyclidine (PCP) Ur S: NOT DETECTED
Tricyclic, Ur Screen: POSITIVE — AB

## 2023-02-04 LAB — POC URINE PREG, ED: Preg Test, Ur: NEGATIVE

## 2023-02-04 LAB — RESP PANEL BY RT-PCR (RSV, FLU A&B, COVID)  RVPGX2
Influenza A by PCR: NEGATIVE
Influenza B by PCR: NEGATIVE
Resp Syncytial Virus by PCR: NEGATIVE
SARS Coronavirus 2 by RT PCR: NEGATIVE

## 2023-02-04 MED ORDER — LORAZEPAM 2 MG PO TABS: 2.0000 mg | ORAL_TABLET | Freq: Two times a day (BID) | ORAL | Status: AC | PRN

## 2023-02-04 MED ORDER — HALOPERIDOL 5 MG PO TABS: 5.0000 mg | ORAL_TABLET | Freq: Two times a day (BID) | ORAL | Status: AC | PRN

## 2023-02-04 MED ORDER — LORAZEPAM 2 MG/ML IJ SOLN
2.0000 mg | Freq: Two times a day (BID) | INTRAMUSCULAR | Status: AC | PRN
  Administered 2023-02-05: 2 mg via INTRAMUSCULAR
  Filled 2023-02-04: qty 1

## 2023-02-04 MED ORDER — HALOPERIDOL LACTATE 5 MG/ML IJ SOLN
5.0000 mg | Freq: Two times a day (BID) | INTRAMUSCULAR | Status: AC | PRN
  Administered 2023-02-05: 5 mg via INTRAMUSCULAR
  Filled 2023-02-04: qty 1

## 2023-02-04 MED ORDER — DIPHENHYDRAMINE HCL 50 MG/ML IJ SOLN
50.0000 mg | Freq: Two times a day (BID) | INTRAMUSCULAR | Status: AC | PRN
  Administered 2023-02-05: 50 mg via INTRAMUSCULAR
  Filled 2023-02-04: qty 1

## 2023-02-04 MED ORDER — ALUM & MAG HYDROXIDE-SIMETH 200-200-20 MG/5ML PO SUSP: 30.0000 mL | ORAL | Status: DC | PRN

## 2023-02-04 MED ORDER — DIPHENHYDRAMINE HCL 25 MG PO CAPS: 50.0000 mg | ORAL_CAPSULE | Freq: Two times a day (BID) | ORAL | Status: AC | PRN

## 2023-02-04 MED ORDER — ACETAMINOPHEN 325 MG PO TABS: 650.0000 mg | ORAL_TABLET | Freq: Four times a day (QID) | ORAL | Status: DC | PRN

## 2023-02-04 MED ORDER — MAGNESIUM HYDROXIDE 400 MG/5ML PO SUSP: 30.0000 mL | Freq: Every day | ORAL | Status: DC | PRN

## 2023-02-04 NOTE — Progress Notes (Signed)
Patient c/o not having pulse continues to be Paranoid stating someone is watching her. V/S WNL support and encouragement provided.

## 2023-02-04 NOTE — Consult Note (Addendum)
[facility] Face-to-Face Psychiatry Consult   Reason for Consult:  Psychiatric Evaluation  Referring Physician:  Dr.  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 Tricia Moran is a 37 y.o. female patient admitted with delusional parasitosis.Tricia Moran  HPI: Patient seen face-to-face.  37 year old female brought in by police under Involuntary Commitment (IVC) from [place] due to paranoid delusions. She reported paranoid delusions, believing that the police were robots and had killed her family. The patient has been experiencing worsening delusions over the past 10 years. The patient is anxious and continues to endorse paranoid delusions, but denies suicidal or homicidal ideations, auditory or visual hallucinations.  Does not appear to be responding to internal stimuli.  The patient has been under the care of an infectious disease provider for perceived parasite issues. She reports worsening sensations of parasites 'sucking' on her head and back. The patient exhibited extreme paranoia, believing that she was being recorded and hearing noises from the ceiling. She asked for water and requested RN to drink it first to ensure it was safe, and refused a temperature check due to fear of being cloned.  She also believes the nurses [employer] phone was a recording device. The patient states that she  prefers counseling over medication. Denies any illicit drug or alcohol use.  Her appetite is satisfactory, and her sleep is fair.   Collateral: Per Demetria, Be., Counselor, Writer received call from patient's mother who states patient has been endorsing symptoms for several years. Mom reports patient has been paranoid about having "parasites on her skin." Patient is fixated on passing  "parasites to anyone she comes in contact with." Mom states patient has been seeing an infectious disease provider and is not currently taking any psych medications. She reports patient is welcome to receive therapy but does not wish to be given a prescription. Writer explained patient will be reviewed for admission and mom will be informed once a bed is available. Mom verbalized understanding.     Past Psychiatric History: Patient presented to the emergency room on March 03 due to complaints of delusional parasitosis and anxiety. A referral was made to [facility]) at that time. Inpatient hospitalization at [facility] with delusional parasitosis October 2016.  Risk to Self:   Risk to Others:   Prior Inpatient Therapy:   Prior Outpatient Therapy:    Past Medical History: No past medical history on file. No past surgical history on file. Family History: No family history on file. Family Psychiatric  History: None reported Social History:  Social History   Substance and Sexual Activity  Alcohol Use Yes     Social History   Substance and Sexual Activity  Drug Use Not on file    Social History   Socioeconomic History   Marital status: Married    Spouse name: Not on file   Number of children: Not on file   Years of education: Not on file   Highest education level: Not on file  Occupational History   Not on file  Tobacco Use   Smoking status: Never   Smokeless tobacco: Not on file  Substance and Sexual Activity   Alcohol use: Yes   Drug use: Not on file   Sexual activity: Not on file  Other Topics Concern   Not  on file  Social History Narrative   Not on file   Social Determinants of Health   Financial Resource Strain: Not on file  Food Insecurity: Not on file  Transportation Needs: Not on file  Physical Activity: Not on file  Stress: Not on file  Social Connections: Not on file   Additional Social History:    Allergies:  No Known  Allergies  Labs:  Results for orders placed or performed during the hospital encounter of 02/03/23 (from the past 48 hour(s))  Comprehensive metabolic panel     Status: Abnormal   Collection Time: 02/03/23  2:40 PM  Result Value Ref Range   Sodium 139 135 - 145 mmol/L   Potassium 3.2 (L) 3.5 - 5.1 mmol/L   Chloride 101 98 - 111 mmol/L   CO2 19 (L) 22 - 32 mmol/L   Glucose, Bld 109 (H) 70 - 99 mg/dL    Comment: Glucose reference range applies only to samples taken after fasting for at least 8 hours.   BUN 14 6 - 20 mg/dL   Creatinine, Ser 0.85 0.44 - 1.00 mg/dL   Calcium 9.6 8.9 - 10.3 mg/dL   Total Protein 8.0 6.5 - 8.1 g/dL   Albumin 4.6 3.5 - 5.0 g/dL   AST 27 15 - 41 U/L   ALT 20 0 - 44 U/L   Alkaline Phosphatase 46 38 - 126 U/L   Total Bilirubin 1.1 0.3 - 1.2 mg/dL   GFR, Estimated >60 >60 mL/min    Comment: (NOTE) Calculated using the CKD-EPI Creatinine Equation (2021)    Anion gap 19 (H) 5 - 15    Comment: Performed at [facility], [address] [place], [place] [address]  Ethanol     Status: None   Collection Time: 02/03/23  2:40 PM  Result Value Ref Range   Alcohol, Ethyl (B) <10 <10 mg/dL    Comment: (NOTE) Lowest detectable limit for serum alcohol is 10 mg/dL.  For medical purposes only. Performed at [facility], [address] [place], [place] [address]   Acetaminophen level     Status: Abnormal   Collection Time: 02/03/23  2:40 PM  Result Value Ref Range   Acetaminophen (Tylenol), Serum <10 (L) 10 - 30 ug/mL    Comment: (NOTE) Therapeutic concentrations vary significantly. A range of 10-30 ug/mL  may be an effective concentration for many patients. However, some  are best treated at concentrations outside of this range. Acetaminophen concentrations >150 ug/mL at 4 hours after ingestion  and >50 ug/mL at 12 hours after ingestion are often associated with  toxic reactions.  Performed at [facility], [address]  [place], [place] [id number]   Salicylate level     Status: Abnormal   Collection Time: 02/03/23  2:40 PM  Result Value Ref Range   Salicylate Lvl Q000111Q (L) 7.0 - 30.0 mg/dL    Comment: Performed at [facility], [address] [place], [place] [address]  CBC with Differential/Platelet     Status: None   Collection Time: 02/03/23  3:07 PM  Result Value Ref Range   WBC 4.3 4.0 - 10.5 K/uL   RBC 4.24 3.87 - 5.11 MIL/uL   Hemoglobin 13.1 12.0 - 15.0 g/dL   HCT 38.1 36.0 - 46.0 %   MCV 89.9 80.0 - 100.0 fL   MCH 30.9 26.0 - 34.0 pg   MCHC 34.4 30.0 - 36.0 g/dL   RDW 13.2 11.5 - 15.5 %  Platelets 286 150 - 400 K/uL   nRBC 0.0 0.0 - 0.2 %   Neutrophils Relative % 61 %   Neutro Abs 2.6 1.7 - 7.7 K/uL   Lymphocytes Relative 27 %   Lymphs Abs 1.2 0.7 - 4.0 K/uL   Monocytes Relative 9 %   Monocytes Absolute 0.4 0.1 - 1.0 K/uL   Eosinophils Relative 1 %   Eosinophils Absolute 0.1 0.0 - 0.5 K/uL   Basophils Relative 1 %   Basophils Absolute 0.0 0.0 - 0.1 K/uL   Immature Granulocytes 1 %   Abs Immature Granulocytes 0.02 0.00 - 0.07 K/uL    Comment: Performed at [facility], [address] [place], [place] [address]    Current Facility-Administered Medications  Medication Dose Route Frequency Provider Last Rate Last Admin   diphenhydrAMINE (BENADRYL) capsule 50 mg  50 mg Oral BID PRN Rabab Currington H, NP       Or   diphenhydrAMINE (BENADRYL) injection 50 mg  50 mg Intramuscular BID PRN Jazelle Achey H, NP       haloperidol (HALDOL) tablet 5 mg  5 mg Oral BID PRN Alexea Blase H, NP       Or   haloperidol lactate (HALDOL) injection 5 mg  5 mg Intramuscular BID PRN Arrayah Connors H, NP       LORazepam (ATIVAN) tablet 2 mg  2 mg Oral BID PRN Cecillia Menees H, NP       Or   LORazepam (ATIVAN) injection 2 mg  2 mg Intramuscular BID PRN Damarko Stitely H, NP       Current Outpatient Medications  Medication Sig Dispense Refill   norethindrone (MICRONOR)  0.35 MG tablet Take 1 tablet by mouth daily.     brompheniramine-pseudoephedrine-DM 30-2-10 MG/5ML syrup Take 5 mLs by mouth 4 (four) times daily as needed. (Patient not taking: Reported on 02/03/2023) 120 mL 0    Musculoskeletal: Strength & Muscle Tone: within normal limits Gait & Station: normal Patient leans: N/A    Psychiatric Specialty Exam: Physical Exam Vitals and nursing note reviewed.  Constitutional:      Appearance: She is normal weight.  HENT:     Head: Normocephalic and atraumatic.     Nose: Nose normal.  Pulmonary:     Effort: Pulmonary effort is normal.  Musculoskeletal:        General: Normal range of motion.     Cervical back: Normal range of motion.  Neurological:     Mental Status: She is alert and oriented to person, place, and time.  Psychiatric:        Attention and Perception: She is inattentive.        Mood and Affect: Mood is anxious. Affect is labile.        Speech: Speech is rapid and pressured.        Behavior: Behavior is hyperactive. Behavior is cooperative.        Thought Content: Thought content is paranoid and delusional. Thought content does not include homicidal or suicidal ideation.        Cognition and Memory: Memory normal.        Judgment: Judgment is impulsive.     Review of Systems  Blood pressure (!) 106/93, pulse (!) 140, temperature 98.1 F (36.7 C), temperature source Oral, resp. rate 20, height '6\' 1"'$  (1.854 m), weight 78.3 kg, SpO2 100 %.Body mass index is 22.77 kg/m.  General Appearance: Bizarre  Eye Contact:  Good  Speech:  Pressured  Volume:  Normal  Mood:  Anxious and Irritable  Affect:  Labile  Thought Process:  Descriptions of Associations: Loose  Orientation:  Full (Time, Place, and Person)  Thought Content:  Delusions and Ideas of Reference:   Paranoia Delusions   Suicidal Thoughts:  No  Homicidal Thoughts:  No  Memory:  Immediate;   Good  Judgement:  Poor  Insight:  Lacking  Psychomotor Activity:   Restlessness  Concentration:  Concentration: Fair  Recall:  [place] of Knowledge:  Fair  Language:  Good  Akathisia:  No  Handed:  Right  AIMS (if indicated):     Assets:  Financial Resources/Insurance Housing Social Support  ADL's:  Intact  Cognition:  WNL  Sleep:   Fair          Physical Exam: Physical Exam Vitals and nursing note reviewed.  Constitutional:      Appearance: She is normal weight.  HENT:     Head: Normocephalic and atraumatic.     Nose: Nose normal.  Pulmonary:     Effort: Pulmonary effort is normal.  Musculoskeletal:        General: Normal range of motion.     Cervical back: Normal range of motion.  Neurological:     Mental Status: She is alert and oriented to person, place, and time.  Psychiatric:        Attention and Perception: She is inattentive.        Mood and Affect: Mood is anxious. Affect is labile.        Speech: Speech is rapid and pressured.        Behavior: Behavior is hyperactive. Behavior is cooperative.        Thought Content: Thought content is paranoid and delusional. Thought content does not include homicidal or suicidal ideation.        Cognition and Memory: Memory normal.        Judgment: Judgment is impulsive.    ROS Blood pressure (!) 106/93, pulse (!) 140, temperature 98.1 F (36.7 C), temperature source Oral, resp. rate 20, height '6\' 1"'$  (1.854 m), weight 78.3 kg, SpO2 100 %. Body mass index is 22.77 kg/m.  Treatment Plan Summary: Daily contact with patient to assess and evaluate symptoms and progress in treatment and Plan Inpatient psychiatric admission for stabilization. Reviewed with Dr. Archie Balboa, [place].  Disposition: Recommend psychiatric Inpatient admission when medically cleared. Supportive therapy provided about ongoing stressors.  Ronny Flurry, NP 02/04/2023 9:30 AM

## 2023-02-04 NOTE — BH Assessment (Signed)
Writer received call from patient's mother who states patient has been endorsing symptoms for several years. Mom reports patient has been paranoid about having "parasites on her skin." Patient is fixated on passing "parasites to anyone she comes in contact with." Mom states patient has been seeing an infectious disease provider and is not currently taking any psych medications. She reports patient is welcome to receive therapy but does not wish to be given a prescription. Writer explained patient will be reviewed for admission and mom will be informed once a bed is available. Mom verbalized understanding.

## 2023-02-04 NOTE — ED Notes (Signed)
Psychiatry at bedside.

## 2023-02-04 NOTE — ED Notes (Addendum)
Pt's mother asked to speak with this RN and told this RN that "pt and spouse are separated and he should not be contacted with any concerns about the pt's care. Only pt's mother and father should be contacted." The mother and father's contact information was verified in the computer. Mom stated that one of her husband's sisters had gone through something similar to what the pt is experiencing and she tried to reach out to them to see what helped the husband's sister as she has not had any repeat episodes after she received treatment, but the family member was reluctant to share any information about the treatment received. Mom stated that when the pt received help before it was because she had parasites in her skin and pt was concerned about passing the parasites on to someone else. This time Mom says that this time the pt knew that she wasn't feeling good and drove herself to [facility] to get help/treatment and they evaluated her in the ED and discharged her. Then mom came and took her to [place] to try and get some help and they waited in the ED for 13 hours and was given a referral for a psychiatrist. Mom also tried taking the pt to [facility] in [place], but the pt would not get out of the car stating that someone she knew worked in one of the buildings close by and that it was a bad place and would cause her harm. Mom eventually took her [facility] which was how they ended up here last time. The pt does not want any medication but the mom thinks that it may be what might help the pt. Mom is concerned because she doesn't know what could have caused this and would like to be contacted by the social worker/case worker and given an update when an available.

## 2023-02-04 NOTE — BH Assessment (Signed)
Comprehensive Clinical Assessment (CCA) Note  02/04/2023 Tricia Moran [id number] Recommendations for Services/Supports/Treatments: Pt pending psych consult/disposition. Tricia Moran is a 37 year old, English speaking, Caucasian female with a hx of delusional disorder, somatic type. Per triage note: Pt presents to the ED via [facility] due to IVC. Pt is in a manic state asking for her mother to help provide additional information. [facility] sent patient over due to delusions and being paranoid. Pt is stating that BPD is a robot and that they are not real. That BPD killed her family.  Pt lying awake in bed this writer's arrival. Pt sat up, with a tense posture and an anxious affect. Pt continued to endorse having somatic preoccupations with chronic physical pain. The pt. identified her main issue as having physical pain that is "hard for me to carry". Pt reported having constant numbness in her head that also impacts her ability to sleep. Pt reported having a decreased appetite. Pt was fixated on the safety and wellbeing of her mother and daughter, repeatedly asking to call her mother to check on her. Pt's speech was coherent and at a normal rate. Motor behavior was normal. Eye contact was good. Pt's mood is anxious; affect is congruent. Pt was pleasant and cooperative with the assessment. Pt denied symptoms of depression and anxiety. The patient denied current SI, HI or AV/H.   Chief Complaint:  Chief Complaint  Patient presents with   IVC   Visit Diagnosis: Delusional disorder, somatic type ([place])     CCA Screening, Triage and Referral (STR)  Patient Reported Information How did you hear about [place]? 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 No data recorded Practice/Facility Name: No data recorded Practice/Facility Phone Number: No data recorded Name of Contact: No data  recorded Contact Number: No data recorded Contact Fax Number: No data recorded Prescriber Name: No data recorded Prescriber Address (if known): No data recorded  What Is the Reason for Your Visit/Call Today? Pt presents to the ED via [facility] due to IVC. Pt is in a manic state asking for her mother to help provide additional information. [facility] sent patient over due to delusions and being paranoid. Pt is stating that BPD is a robot and that they are not real. That BPD killed her family.  How Long Has This Been Causing You Problems? > than 6 months  What Do You Feel Would Help You the Most Today? Treatment for Depression or other mood problem (Counseling/holistic approach)   Have You Recently Been in Any Inpatient Treatment (Hospital/Detox/Crisis Center/28-Day Program)? No data recorded Name/Location of Program/Hospital:No data recorded How Long Were You There? No data recorded When Were You Discharged? No data recorded  Have You Ever Received Services From [facility] Before? No data recorded Who Do You See at [facility]? No data recorded  Have You Recently Had Any Thoughts About Hurting Yourself? No  Are You Planning to Commit Suicide/Harm Yourself At This time? No   Have you Recently Had Thoughts About [place]? No  Explanation: No data recorded  Have You Used Any Alcohol or Drugs in the Past 24 Hours? No  How Long Ago Did You Use Drugs or Alcohol? No data recorded What Did You Use and How Much? n/a   Do You Currently Have a Therapist/Psychiatrist? No  Name of Therapist/Psychiatrist: n/a   Have You Been Recently Discharged From Any Office Practice or  Programs? No  Explanation of Discharge From Practice/Program: n/a     CCA Screening Triage Referral Assessment Type of Contact: Face-to-Face  Is this Initial or Reassessment? No data recorded Date Telepsych consult ordered in [facility]:  No data recorded Time Telepsych consult ordered in [facility]:  No data  recorded  Patient Reported Information Reviewed? No data recorded Patient Left Without Being Seen? No data recorded Reason for Not Completing Assessment: No data recorded  Collateral Involvement: Kizzie Bane (Mother) [phone] for collateral.   Does Patient Have a [place]? No data recorded Name and Contact of Legal Guardian: No data recorded If Minor and Not Living with Parent(s), Who has Custody? n/a  Is CPS involved or ever been involved? Never  Is APS involved or ever been involved? Never   Patient Determined To Be At Risk for Harm To Self or Others Based on Review of Patient Reported Information or Presenting Complaint? No  Method: No Plan  Availability of Means: No access or NA  Intent: Vague intent or NA  Notification Required: No need or identified person  Additional Information for Danger to Others Potential: Active psychosis  Additional Comments for Danger to Others Potential: n/a  Are There Guns or Other Weapons in Your Home? No  Types of Guns/Weapons: n/a  Are These Weapons Safely Secured?                            -- (n/a)  Who Could Verify You Are Able To Have These Secured: No data recorded Do You Have any Outstanding Charges, Pending Court Dates, Parole/Probation? No data recorded Contacted To Inform of Risk of Harm To Self or Others: No data recorded  Location of Assessment: [facility] ED   Does Patient Present under Involuntary Commitment? Yes  IVC Papers Initial File Date: No data recorded  [place] of Residence: [place]   Patient Currently Receiving the Following Services: Not Receiving Services   Determination of Need: Emergent (2 hours)   Options For Referral: ED Visit     CCA Biopsychosocial Intake/Chief Complaint:  No data recorded Current Symptoms/Problems: No data recorded  Patient Reported Schizophrenia/Schizoaffective Diagnosis in Past: No   Strengths: Pt has a supportive family, pt is employed, pt  has stable housing, pt able to identify needs and ask for help.  Preferences: No data recorded Abilities: No data recorded  Type of Services Patient Feels are Needed: No data recorded  Initial Clinical Notes/Concerns: No data recorded  Mental Health Symptoms Depression:   None   Duration of Depressive symptoms: No data recorded  Mania:   None   Anxiety:    Tension; Worrying   Psychosis:   Delusions   Duration of Psychotic symptoms:  Greater than six months   Trauma:   None   Obsessions:   Cause anxiety; Intrusive/time consuming; Absent; Disrupts routine/functioning; Recurrent & persistent thoughts/impulses/images   Compulsions:   None   Inattention:   None   Hyperactivity/Impulsivity:   None   Oppositional/Defiant Behaviors:   None   Emotional Irregularity:   None   Other Mood/Personality Symptoms:   Pt is hyperfocused on paranoid delusions.    Mental Status Exam Appearance and self-care  Stature:   Small   Weight:   Average weight   Clothing:   Casual   Grooming:   Neglected   Cosmetic use:   None   Posture/gait:   Tense   Motor activity:   Not Remarkable  Sensorium  Attention:   Vigilant   Concentration:   Anxiety interferes   Orientation:   Person; Object; Place; Situation   Recall/memory:   Normal   Affect and Mood  Affect:   Anxious   Mood:   Anxious   Relating  Eye contact:   Normal   Facial expression:   Anxious   Attitude toward examiner:   Cooperative   Thought and Language  Speech flow:  Clear and Coherent   Thought content:   Delusions   Preoccupation:   Somatic   Hallucinations:   None   Organization:  No data recorded  [employer] of Knowledge:   Average   Intelligence:   Average   Abstraction:   Normal   Judgement:   Poor   Reality Testing:   Distorted   Insight:   Poor   Decision Making:   Confused   Social Functioning  Social Maturity:    Self-centered   Social Judgement:   Normal   Stress  Stressors:   Legal   Coping Ability:   Exhausted; Deficient supports   Skill Deficits:   Decision making   Supports:   Family; Support needed     Religion: Religion/Spirituality Are You A Religious Person?: Yes What is Your Religious Affiliation?: Christian  Leisure/Recreation: Leisure / Recreation Do You Have Hobbies?: No  Exercise/Diet: Exercise/Diet Do You Exercise?: No Have You Gained or Lost A Significant Amount of Weight in the Past Six Months?: No Do You Follow a Special Diet?: No Do You Have Any Trouble Sleeping?: Yes Explanation of Sleeping Difficulties: Pt reported having sleep disturbance due to chronic pain.   CCA Employment/Education Employment/Work Situation: Employment / Work Situation Employment Situation: Employed Work Stressors: None reported Patient's Job has Been Impacted by Current Illness: No Has Patient ever Been in Passenger transport manager?: No  Education: Education Is Patient Currently Attending School?: No Did Physicist, medical?: Yes Did You Have An Individualized Education Program (IIEP): No Did You Have Any Difficulty At [employer]?: No Patient's Education Has Been Impacted by Current Illness: No   CCA Family/Childhood History Family and Relationship History: Family history Marital status: Separated Separated, when?: UTA What types of issues is patient dealing with in the relationship?: Pt reports her husband does not understand what she is going through and it causes conflict  Does patient have children?: Yes How many children?: 1 How is patient's relationship with their children?: "Preteen" per pt.  Childhood History:  Childhood History By whom was/is the patient raised?: Both parents Did patient suffer any verbal/emotional/physical/sexual abuse as a child?: No Did patient suffer from severe childhood neglect?: No Has patient ever been sexually abused/assaulted/raped as an  adolescent or adult?: No Was the patient ever a victim of a crime or a disaster?: No Witnessed domestic violence?: No Has patient been affected by domestic violence as an adult?: No  Child/Adolescent Assessment:     CCA Substance Use Alcohol/Drug Use: Alcohol / Drug Use Pain Medications: See MAR Prescriptions: See MAR Over the Counter: See MAR History of alcohol / drug use?: No history of alcohol / drug abuse                         ASAM's:  Six Dimensions of Multidimensional Assessment  Dimension 1:  Acute Intoxication and/or Withdrawal Potential:      Dimension 2:  Biomedical Conditions and Complications:      Dimension 3:  Emotional, Behavioral, or Cognitive Conditions  and Complications:     Dimension 4:  Readiness to Change:     Dimension 5:  Relapse, Continued use, or Continued Problem Potential:     Dimension 6:  Recovery/Living Environment:     ASAM Severity Score:    ASAM Recommended Level of Treatment:     Substance use Disorder (SUD)    Recommendations for Services/Supports/Treatments:    DSM5 Diagnoses: Patient Active Problem List   Diagnosis Date Noted   Psychosis ([place]) 02/03/2023   Snapping hip syndrome, 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

## 2023-02-04 NOTE — Progress Notes (Signed)
  ADMISSION DAR NOTE:   Patient present under IVC from [place] alert and oriented  with flat, sad affect appears very suspicious and paranoid. Patient required multiple reassurance of safety continues to ask if she was safe pointing at all the cameras. Patient is tangential and rapid pressured speech. Denies SI/HI and verbally contracted for safety.  Patient reports going to [facility]  infectious disease for "parasites and headaches" then pain moved from head to her back. Patient denies pain at present. Pt reported her Mother wanted her to come to the hospital due to missing work  several times. " someone is watching me and trying to clone me all the time especially around computers, Patient reported poor sleep and symptoms has been getting worse in the past week. My Mother wanted me to be safe here but we discussed about not taking medication, I believe in going the holistic way"  Patient lives alone has shared custody with her ex spouse sometimes her daughter stays with her. She is an Automotive engineer.  Denies history of abuse. Stated she was admitted here 10 years ago.  Emotional support and availability offered to Patient as needed. Skin assessment done Patient has a bruise to her left ankle does not recall where it came from.  Belongings searched per protocol. Items deemed contraband secured in locker. Unit orientation and routine discussed, Care Plan reviewed as well and Patient verbalized understanding. Fluids and Food offered, tolerated well. Q15 minutes safety checks initiated without self harm gestures.

## 2023-02-04 NOTE — ED Notes (Signed)
Staff continuing to redirect pt to stay in room. Staff told her to sit on her bed or we would need to close the door. Pt currently siting on the bed

## 2023-02-04 NOTE — BH Assessment (Signed)
Patient is to be admitted to [facility] BMU today 02/04/23 by Dr. Weber Cooks.  Attending Physician will be Dr.  Weber Cooks .   Patient has been assigned to room 304, by [facility] Charge Nurse, Serafina Topham.    ER staff is aware of the admission: Minna Merritts, ER Secretary   Dr. Archie Balboa, ER MD  Lanelle Bal, Patient's Nurse

## 2023-02-04 NOTE — ED Notes (Signed)
Mother called and requested update as to patient's status. Update provided. Informed mom of visitor and pt call policy

## 2023-02-04 NOTE — ED Notes (Signed)
Pt keeps standing at front of door. Staff redirects her to stay in her room but pt will come back to stand at door.

## 2023-02-04 NOTE — BH Assessment (Addendum)
Upon obtaining pt's permission, this writer attempted to contact Kizzie Bane (Mother) [phone] for collateral. Mother did not answer. Writer left HIPAA compliant voicemail requesting a call back.

## 2023-02-04 NOTE — Plan of Care (Signed)
  Problem: Education: Goal: Knowledge of General Education information will improve Description Including pain rating scale, medication(s)/side effects and non-pharmacologic comfort measures Outcome: Progressing   Problem: Health Behavior/Discharge Planning: Goal: Ability to manage health-related needs will improve Outcome: Progressing   

## 2023-02-04 NOTE — ED Notes (Signed)
Pt. Refused vitals check.

## 2023-02-04 NOTE — Tx Team (Signed)
Initial Treatment Plan 02/04/2023 10:28 PM [name] [id number]    PATIENT STRESSORS: Medication change or noncompliance     PATIENT STRENGTHS: Financial means  Supportive family/friends    PATIENT IDENTIFIED PROBLEMS: "I think you are trying to clone me"  "I am dying"  "I don't take she does holistic way"                 DISCHARGE CRITERIA:  Improved stabilization in mood, thinking, and/or behavior Medical problems require only outpatient monitoring Motivation to continue treatment in a less acute level of care Need for constant or close observation no longer present Verbal commitment to aftercare and medication compliance  PRELIMINARY DISCHARGE PLAN: Outpatient therapy Return to previous living arrangement  PATIENT/FAMILY INVOLVEMENT: This treatment plan has been presented to and reviewed with the patient, Tricia Moran.  The patient and family have been given the opportunity to ask questions and make suggestions.  Wylie Hail, RN 02/04/2023, 10:28 PM

## 2023-02-04 NOTE — ED Notes (Signed)
Report given to Cuda RN to BMU room 304.

## 2023-02-04 NOTE — ED Notes (Signed)
Patient very parinod toward staff. Patient is accusing staff of taking her organ and giving them to other patients.  Patient has been redirected multiple times. Security with patient at this time.

## 2023-02-04 NOTE — ED Notes (Signed)
ivc/pending consult.Marland KitchenMarland Kitchen

## 2023-02-05 ENCOUNTER — Encounter: Payer: Self-pay | Admitting: Psychiatry

## 2023-02-05 DIAGNOSIS — F22 Delusional disorders: Secondary | ICD-10-CM | POA: Insufficient documentation

## 2023-02-05 DIAGNOSIS — F4325 Adjustment disorder with mixed disturbance of emotions and conduct: Principal | ICD-10-CM | POA: Insufficient documentation

## 2023-02-05 MED ORDER — ADULT MULTIVITAMIN W/MINERALS CH
1.0000 | ORAL_TABLET | Freq: Every day | ORAL | Status: DC
  Filled 2023-02-05 (×4): qty 1

## 2023-02-05 MED ORDER — ENSURE ENLIVE PO LIQD
237.0000 mL | Freq: Three times a day (TID) | ORAL | Status: DC
  Administered 2023-02-05 – 2023-02-07 (×3): 237 mL via ORAL

## 2023-02-05 MED ORDER — MELATONIN 5 MG PO TABS
10.0000 mg | ORAL_TABLET | Freq: Every evening | ORAL | Status: DC | PRN
  Administered 2023-02-06 – 2023-02-10 (×5): 10 mg via ORAL
  Filled 2023-02-05 (×5): qty 2

## 2023-02-05 MED ORDER — OLANZAPINE 10 MG PO TBDP
10.0000 mg | ORAL_TABLET | Freq: Every day | ORAL | Status: DC
  Administered 2023-02-05: 10 mg via ORAL
  Filled 2023-02-05: qty 1

## 2023-02-05 NOTE — BH IP Treatment Plan (Signed)
Interdisciplinary Treatment and Diagnostic Plan Update  02/05/2023 Time of Session: 09:07 Tricia Moran MRN: [id number]  Principal Diagnosis: Delusional disorder [facility])  Secondary Diagnoses: Principal Problem:   Delusional disorder ([place])   Current Medications:  Current Facility-Administered Medications  Medication Dose Route Frequency Provider Last Rate Last Admin   acetaminophen (TYLENOL) tablet 650 mg  650 mg Oral Q6H PRN Bennett, Christal H, NP       alum & mag hydroxide-simeth (MAALOX/MYLANTA) 200-200-20 MG/5ML suspension 30 mL  30 mL Oral Q4H PRN Bennett, Christal H, NP       diphenhydrAMINE (BENADRYL) capsule 50 mg  50 mg Oral BID PRN Bennett, Christal H, NP       Or   diphenhydrAMINE (BENADRYL) injection 50 mg  50 mg Intramuscular BID PRN Bennett, Christal H, NP   50 mg at 02/05/23 0207   feeding supplement (ENSURE ENLIVE / ENSURE PLUS) liquid 237 mL  237 mL Oral TID BM Clapacs, John T, MD       haloperidol (HALDOL) tablet 5 mg  5 mg Oral BID PRN Bennett, Christal H, NP       Or   haloperidol lactate (HALDOL) injection 5 mg  5 mg Intramuscular BID PRN Bennett, Christal H, NP   5 mg at 02/05/23 0210   LORazepam (ATIVAN) tablet 2 mg  2 mg Oral BID PRN Bennett, Christal H, NP       Or   LORazepam (ATIVAN) injection 2 mg  2 mg Intramuscular BID PRN Richardson Landry, Christal H, NP   2 mg at 02/05/23 0210   magnesium hydroxide (MILK OF MAGNESIA) suspension 30 mL  30 mL Oral Daily PRN Bennett, Christal H, NP       multivitamin with minerals tablet 1 tablet  1 tablet Oral Daily Clapacs, John T, MD       PTA Medications: Medications Prior to Admission  Medication Sig Dispense Refill Last Dose   gabapentin (NEURONTIN) 100 MG capsule Take 100 mg by mouth at bedtime as needed.      nortriptyline (PAMELOR) 25 MG capsule Take 25 mg by mouth at bedtime.      brompheniramine-pseudoephedrine-DM 30-2-10 MG/5ML syrup Take 5 mLs by mouth 4 (four) times daily as needed. (Patient not taking: Reported on  02/03/2023) 120 mL 0    norethindrone (MICRONOR) 0.35 MG tablet Take 1 tablet by mouth daily.       Patient Stressors: Medication change or noncompliance    Patient Strengths: Scientist, research (life sciences)  Supportive family/friends   Treatment Modalities: Medication Management, Group therapy, Case management,  1 to 1 session with clinician, Psychoeducation, Recreational therapy.   Physician Treatment Plan for Primary Diagnosis: Delusional disorder ([place]) Long Term Goal(s):     Short Term Goals:    Medication Management: Evaluate patient's response, side effects, and tolerance of medication regimen.  Therapeutic Interventions: 1 to 1 sessions, Unit Group sessions and Medication administration.  Evaluation of Outcomes: Not Met  Physician Treatment Plan for Secondary Diagnosis: Principal Problem:   Delusional disorder ([place])  Long Term Goal(s):     Short Term Goals:       Medication Management: Evaluate patient's response, side effects, and tolerance of medication regimen.  Therapeutic Interventions: 1 to 1 sessions, Unit Group sessions and Medication administration.  Evaluation of Outcomes: Not Met   RN Treatment Plan for Primary Diagnosis: Delusional disorder [facility]) Long Term Goal(s): Knowledge of disease and therapeutic regimen to maintain health will improve  Short Term Goals: Ability to remain free from injury will  improve, Ability to verbalize frustration and anger appropriately will improve, Ability to demonstrate self-control, Ability to participate in decision making will improve, Ability to verbalize feelings will improve, Ability to disclose and discuss suicidal ideas, Ability to identify and develop effective coping behaviors will improve, and Compliance with prescribed medications will improve  Medication Management: RN will administer medications as ordered by provider, will assess and evaluate patient's response and provide education to patient for prescribed medication. RN will  report any adverse and/or side effects to prescribing provider.  Therapeutic Interventions: 1 on 1 counseling sessions, Psychoeducation, Medication administration, Evaluate responses to treatment, Monitor vital signs and CBGs as ordered, Perform/monitor CIWA, COWS, AIMS and Fall Risk screenings as ordered, Perform wound care treatments as ordered.  Evaluation of Outcomes: Not Met   LCSW Treatment Plan for Primary Diagnosis: Delusional disorder [facility]) Long Term Goal(s): Safe transition to appropriate next level of care at discharge, Engage patient in therapeutic group addressing interpersonal concerns.  Short Term Goals: Engage patient in aftercare planning with referrals and resources, Increase social support, Increase ability to appropriately verbalize feelings, Increase emotional regulation, Facilitate acceptance of mental health diagnosis and concerns, Facilitate patient progression through stages of change regarding substance use diagnoses and concerns, Identify triggers associated with mental health/substance abuse issues, and Increase skills for wellness and recovery  Therapeutic Interventions: Assess for all discharge needs, 1 to 1 time with Social worker, Explore available resources and support systems, Assess for adequacy in community support network, Educate family and significant other(s) on suicide prevention, Complete Psychosocial Assessment, Interpersonal group therapy.  Evaluation of Outcomes: Not Met   Progress in Treatment: Attending groups: No. Participating in groups: No. Taking medication as prescribed: No. Toleration medication: No. Family/Significant other contact made: No, will contact:  if given permission.  Patient understands diagnosis: Yes. Discussing patient identified problems/goals with staff: No. Medical problems stabilized or resolved: Yes. Denies suicidal/homicidal ideation: No. Issues/concerns per patient self-inventory: No. Other: none.  New problem(s)  identified: No, Describe:  none identified.   New Short Term/Long Term Goal(s): detox, elimination of symptoms of psychosis, medication management for mood stabilization; elimination of SI thoughts; development of comprehensive mental wellness/sobriety plan.  Patient Goals:  Pt unable to participate in treatment team meeting as she was sleeping after being given agitation protocol the night prior.   Discharge Plan or Barriers: CSW will assist pt with development of an appropriate aftercare/discharge plan.   Reason for Continuation of Hospitalization: Anxiety Medication stabilization Other; describe paranoia.  Estimated Length of Stay: 1-7 days  Last 3 [place] Suicide Severity Risk Score: Flowsheet Row Admission (Current) from 02/04/2023 in [place] ED from 02/03/2023 in [facility] Emergency Department at [facility] ED from 11/13/2022 in [facility] Emergency Department at [place] No Risk No Risk No Risk       Last PHQ 2/9 Scores:     No data to display          Scribe for Treatment Team: Shirl Harris, LCSW 02/05/2023 9:21 AM

## 2023-02-05 NOTE — Group Note (Signed)
Recreation Therapy Group Note   Group Topic:Relaxation  Group Date: 02/05/2023 Start Time: 1000 End Time: 1045 Facilitators: Vilma Prader, LRT, CTRS Location:  [facility] Room  Group Description: Mindfulness Body Scan. LRT asked pt their current level of stress and anxiety. LRT educated on the benefits of mindfulness and how it can apply to everyday life post-discharge. LRT and pt's followed along to an audio script of a "mindfulness body scan" video. LRT asked pt their level of stress and anxiety once the prompt was finished.   Affect/Mood: N/A   Participation Level: Did not attend    Clinical Observations/Individualized Feedback: Tricia Moran did not attend group due to resting in her room.  Plan: Continue to engage patient in RT group sessions 2-3x/week.   Vilma Prader, LRT, CTRS 02/05/2023 11:17 AM

## 2023-02-05 NOTE — H&P (Signed)
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  37 year old woman sent here under involuntary commitment filed at [facility] because she presented there with agitation and paranoid somatic delusions.  Patient was only partially cooperative with the interview.  Became frustrated with questions easily and refused to answer several basic questions on the grounds that they were too personal.  Kept the door of my office open throughout the interview.  Decreased eye contact.  Looked jittery.  Patient's lips were very dry looked as though she had not been well hydrated.  Patient reports that her physical symptoms have worsened in the last week.  She has been feeling of pain or discomfort from her head down her neck and back.  The discomfort is different than the primarily skin issues she had before and has made her much more just uncomfortable.  She admits that she has not slept very well in several days.  Probably about 5.  Also been eating a little bit less.  Denies any suicidal thoughts.  Denies homicidal thoughts.  Denies any auditory hallucinations.  Patient has an established history of somatic delusional parasitosis.  Not currently taking any prescription medicine.  Denies alcohol or drug abuse. Associated Signs/Symptoms: Depression Symptoms:  insomnia, anxiety, loss of energy/fatigue, disturbed sleep, (Hypo) Manic Symptoms:  Irritable Mood, Anxiety Symptoms:  Excessive Worry, Psychotic Symptoms:  Paranoia, PTSD Symptoms: Negative Total Time  spent with patient: 45 minutes  Past Psychiatric History: Patient had a previous hospitalization here in 2016 with similar symptoms complaining of headaches and somatic complaints and having the delusional parasitosis already in affect.  She was treated with olanzapine at that time and reportedly showed some improvement.  Since then has not been following up with a psychiatric provider.  Denies any history of suicidal behavior denies any history of violence.  No known previous diagnosis of bipolar disorder or major depression.  Had recently been prescribed a low dose of nortriptyline for headaches.  Not clear if she has been taking it regularly.  Is the patient at risk to self? Yes.    Has the patient been a risk to self in the past 6 months? Yes.    Has the patient been a risk to self within the distant past? Yes.    Is the patient a risk to others? No.  Has the patient been a risk to others in the past 6 months? No.  Has the patient been a risk to others within the distant past? No.   [place] Scale:  [place] Admission (Current) from 02/04/2023 in [facility] ED from 02/03/2023 in [facility] Emergency Department at [facility] ED from 11/13/2022 in [facility] Emergency Department at [place] No Risk No Risk No Risk        Prior Inpatient Therapy: Yes.   If yes, describe admitted here in 2016 with similar symptoms Prior Outpatient Therapy: Yes.   If yes, describe has seen counselors but not psychiatrist as far as I can tell  Alcohol Screening: Patient refused Alcohol Screening Tool: Yes 1. How often do you have a drink containing  alcohol?: Never 2. How many drinks containing alcohol do you have on a typical day when you are drinking?: 1 or 2 3. How often do you have six or more drinks on one occasion?: Never AUDIT-C Score: 0 4. How often during the last year have you found that you were not able to stop drinking once you had  started?: Never 5. How often during the last year have you failed to do what was normally expected from you because of drinking?: Never 6. How often during the last year have you needed a first drink in the morning to get yourself going after a heavy drinking session?: Never 7. How often during the last year have you had a feeling of guilt of remorse after drinking?: Never 8. How often during the last year have you been unable to remember what happened the night before because you had been drinking?: Never 9. Have you or someone else been injured as a result of your drinking?: No 10. Has a relative or friend or a doctor or another health worker been concerned about your drinking or suggested you cut down?: No Alcohol Use Disorder Identification Test Final Score (AUDIT): 0 Alcohol Brief Interventions/Follow-up: Alcohol education/Brief advice Substance Abuse History in the last 12 months:  No. Consequences of Substance Abuse: Negative Previous Psychotropic Medications: Yes  Psychological Evaluations: Yes  Past Medical History: History reviewed. No pertinent past medical history. History reviewed. No pertinent surgical history. Family History: History reviewed. No pertinent family history. Family Psychiatric  History: None reported Tobacco Screening:  Social History   Tobacco Use  Smoking Status Never  Smokeless Tobacco Not on file    [place] Tobacco Counseling     Are you interested in Tobacco Cessation Medications?  N/A, patient does not use tobacco products Counseled patient on smoking cessation:  N/A, patient does not use tobacco products Reason Tobacco Screening Not Completed: Patient Refused Screening       Social History:  Social History   Substance and Sexual Activity  Alcohol Use Yes     Social History   Substance and Sexual Activity  Drug Use Never    Additional Social History: Marital status: Separated Separated, when?: "6 months" What types of issues is patient  dealing with in the relationship?: Pt declined to state reasons for separation. Does patient have children?: Yes How many children?: 1 How is patient's relationship with their children?: Patient reports that her daughter is "amazing".  She reports that daughter is with daughters father at this time.                         Allergies:  No Known Allergies Lab Results:  Results for orders placed or performed during the hospital encounter of 02/03/23 (from the past 48 hour(s))  Resp panel by RT-PCR (RSV, Flu A&B, Covid) Anterior Nasal Swab     Status: None   Collection Time: 02/04/23 10:54 AM   Specimen: Anterior Nasal Swab  Result Value Ref Range   SARS Coronavirus 2 by RT PCR NEGATIVE NEGATIVE    Comment: (NOTE) SARS-CoV-2 target nucleic acids are NOT DETECTED.  The SARS-CoV-2 RNA is generally detectable in upper respiratory specimens during the acute phase of infection. The lowest concentration of SARS-CoV-2 viral copies this assay can detect is 138 copies/mL. A negative result does not preclude SARS-Cov-2 infection and should not be used as the sole basis for treatment or other patient management decisions. A negative result may occur with  improper specimen collection/handling, submission of specimen other than nasopharyngeal swab, presence of viral mutation(s) within the areas targeted by this assay, and inadequate number of viral copies(<138 copies/mL). A negative result must be combined with clinical observations, patient history, and epidemiological information. The expected result is Negative.  Fact Sheet for Patients:  EntrepreneurPulse.com.au  Fact Sheet for Healthcare Providers:  IncredibleEmployment.be  This test is no t yet approved or cleared by the [place] FDA and  has been authorized for detection and/or diagnosis of SARS-CoV-2 by FDA under an Emergency Use Authorization (EUA). This EUA will remain  in effect  (meaning this test can be used) for the duration of the COVID-19 declaration under Section 564(b)(1) of the Act, 21 U.S.C.section 360bbb-3(b)(1), unless the authorization is terminated  or revoked sooner.       Influenza A by PCR NEGATIVE NEGATIVE   Influenza B by PCR NEGATIVE NEGATIVE    Comment: (NOTE) The Xpert Xpress SARS-CoV-2/FLU/RSV plus assay is intended as an aid in the diagnosis of influenza from Nasopharyngeal swab specimens and should not be used as a sole basis for treatment. Nasal washings and aspirates are unacceptable for Xpert Xpress SARS-CoV-2/FLU/RSV testing.  Fact Sheet for Patients: EntrepreneurPulse.com.au  Fact Sheet for Healthcare Providers: IncredibleEmployment.be  This test is not yet approved or cleared by the [place] FDA and has been authorized for detection and/or diagnosis of SARS-CoV-2 by FDA under an Emergency Use Authorization (EUA). This EUA will remain in effect (meaning this test can be used) for the duration of the COVID-19 declaration under Section 564(b)(1) of the Act, 21 U.S.C. section 360bbb-3(b)(1), unless the authorization is terminated or revoked.     Resp Syncytial Virus by PCR NEGATIVE NEGATIVE    Comment: (NOTE) Fact Sheet for Patients: EntrepreneurPulse.com.au  Fact Sheet for Healthcare Providers: IncredibleEmployment.be  This test is not yet approved or cleared by the [place] FDA and has been authorized for detection and/or diagnosis of SARS-CoV-2 by FDA under an Emergency Use Authorization (EUA). This EUA will remain in effect (meaning this test can be used) for the duration of the COVID-19 declaration under Section 564(b)(1) of the Act, 21 U.S.C. section 360bbb-3(b)(1), unless the authorization is terminated or revoked.  Performed at [facility], [address] [place], [place] [address]   Urine Drug Screen, Qualitative      Status: Abnormal   Collection Time: 02/04/23 12:47 PM  Result Value Ref Range   Tricyclic, Ur Screen POSITIVE (A) NONE DETECTED   Amphetamines, Ur Screen NONE DETECTED NONE DETECTED   MDMA (Ecstasy)Ur Screen NONE DETECTED NONE DETECTED   Cocaine Metabolite,Ur [place] NONE DETECTED NONE DETECTED   Opiate, Ur Screen NONE DETECTED NONE DETECTED   Phencyclidine (PCP) Ur S NONE DETECTED NONE DETECTED   Cannabinoid 50 Ng, Ur [place] NONE DETECTED NONE DETECTED   Barbiturates, Ur Screen NONE DETECTED NONE DETECTED   Benzodiazepine, Ur Scrn POSITIVE (A) NONE DETECTED   Methadone Scn, Ur NONE DETECTED NONE DETECTED    Comment: (NOTE) Tricyclics + metabolites, urine    Cutoff 1000 ng/mL Amphetamines + metabolites, urine  Cutoff 1000 ng/mL MDMA (Ecstasy), urine              Cutoff 500 ng/mL Cocaine Metabolite, urine          Cutoff 300 ng/mL Opiate + metabolites, urine        Cutoff 300 ng/mL Phencyclidine (PCP), urine         Cutoff 25 ng/mL Cannabinoid, urine  Cutoff 50 ng/mL Barbiturates + metabolites, urine  Cutoff 200 ng/mL Benzodiazepine, urine              Cutoff 200 ng/mL Methadone, urine                   Cutoff 300 ng/mL  The urine drug screen provides only a preliminary, unconfirmed analytical test result and should not be used for non-medical purposes. Clinical consideration and professional judgment should be applied to any positive drug screen result due to possible interfering substances. A more specific alternate chemical method must be used in order to obtain a confirmed analytical result. Gas chromatography / mass spectrometry (GC/MS) is the preferred confirm atory method. Performed at [facility], [address] [place], [place] [address]   POC urine preg, ED     Status: None   Collection Time: 02/04/23  1:46 PM  Result Value Ref Range   Preg Test, Ur Negative Negative    Blood Alcohol level:  Lab Results  Component Value Date   ETH <10 02/03/2023    ETH <5 123XX123    Metabolic Disorder Labs:  Lab Results  Component Value Date   HGBA1C 5.0 09/08/2015   No results found for: "PROLACTIN" Lab Results  Component Value Date   CHOL 118 09/08/2015   TRIG 64 09/08/2015   HDL 55 09/08/2015   CHOLHDL 2.1 09/08/2015   VLDL 13 09/08/2015   LDLCALC 50 09/08/2015    Current Medications: Current Facility-Administered Medications  Medication Dose Route Frequency Provider Last Rate Last Admin   acetaminophen (TYLENOL) tablet 650 mg  650 mg Oral Q6H PRN Bennett, Christal H, NP       alum & mag hydroxide-simeth (MAALOX/MYLANTA) 200-200-20 MG/5ML suspension 30 mL  30 mL Oral Q4H PRN Bennett, Christal H, NP       diphenhydrAMINE (BENADRYL) capsule 50 mg  50 mg Oral BID PRN Bennett, Christal H, NP       Or   diphenhydrAMINE (BENADRYL) injection 50 mg  50 mg Intramuscular BID PRN Bennett, Christal H, NP   50 mg at 02/05/23 0207   feeding supplement (ENSURE ENLIVE / ENSURE PLUS) liquid 237 mL  237 mL Oral TID BM Kabeer Hoagland T, MD       haloperidol (HALDOL) tablet 5 mg  5 mg Oral BID PRN Bennett, Christal H, NP       Or   haloperidol lactate (HALDOL) injection 5 mg  5 mg Intramuscular BID PRN Bennett, Christal H, NP   5 mg at 02/05/23 0210   LORazepam (ATIVAN) tablet 2 mg  2 mg Oral BID PRN Bennett, Christal H, NP       Or   LORazepam (ATIVAN) injection 2 mg  2 mg Intramuscular BID PRN Richardson Landry, Christal H, NP   2 mg at 02/05/23 0210   magnesium hydroxide (MILK OF MAGNESIA) suspension 30 mL  30 mL Oral Daily PRN Bennett, Christal H, NP       multivitamin with minerals tablet 1 tablet  1 tablet Oral Daily Maruice Pieroni T, MD       OLANZapine zydis (ZYPREXA) disintegrating tablet 10 mg  10 mg Oral QHS Nicandro Perrault T, MD       PTA Medications: Medications Prior to Admission  Medication Sig Dispense Refill Last Dose   gabapentin (NEURONTIN) 100 MG capsule Take 100 mg by mouth at bedtime as needed.   Past Week   norethindrone (MICRONOR) 0.35  MG tablet Take 1 tablet by  mouth daily.   Past Week   nortriptyline (PAMELOR) 25 MG capsule Take 25 mg by mouth at bedtime.   Past Week   brompheniramine-pseudoephedrine-DM 30-2-10 MG/5ML syrup Take 5 mLs by mouth 4 (four) times daily as needed. (Patient not taking: Reported on 02/03/2023) 120 mL 0 Not Taking    Musculoskeletal: Strength & Muscle Tone: within normal limits Gait & Station: normal Patient leans: N/A            Psychiatric Specialty Exam:  Presentation  General Appearance: No data recorded Eye Contact:No data recorded Speech:No data recorded Speech Volume:No data recorded Handedness:No data recorded  Mood and Affect  Mood:No data recorded Affect:No data recorded  Thought Process  Thought Processes:No data recorded Duration of Psychotic Symptoms: Chronic delusions and paranoia seems to have been worse for the last week. Past Diagnosis of Schizophrenia or Psychoactive disorder: No  Descriptions of Associations:No data recorded Orientation:No data recorded Thought Content:No data recorded Hallucinations:No data recorded Ideas of Reference:No data recorded Suicidal Thoughts:No data recorded Homicidal Thoughts:No data recorded  Sensorium  Memory:No data recorded Judgment:No data recorded Insight:No data recorded  Executive Functions  Concentration:No data recorded Attention Span:No data recorded Recall:No data recorded [employer] [place] recorded Language:No data recorded  Psychomotor Activity  Psychomotor Activity:No data recorded  Assets  Assets:No data recorded  Sleep  Sleep:No data recorded   Physical Exam: Physical Exam Vitals and nursing note reviewed.  Constitutional:      Appearance: Normal appearance.  HENT:     Head: Normocephalic and atraumatic.     Mouth/Throat:     Pharynx: Oropharynx is clear.  Eyes:     Pupils: Pupils are equal, round, and reactive to light.  Cardiovascular:     Rate and Rhythm: Normal rate  and regular rhythm.  Pulmonary:     Effort: Pulmonary effort is normal.     Breath sounds: Normal breath sounds.  Abdominal:     General: Abdomen is flat.     Palpations: Abdomen is soft.  Musculoskeletal:        General: Normal range of motion.  Skin:    General: Skin is warm and dry.  Neurological:     General: No focal deficit present.     Mental Status: She is alert. Mental status is at baseline.  Psychiatric:        Attention and Perception: She is inattentive.        Mood and Affect: Mood is anxious. Affect is blunt.        Speech: Speech normal.        Behavior: Behavior is agitated. Behavior is not aggressive.        Thought Content: Thought content is paranoid.        Cognition and Memory: Cognition normal.        Judgment: Judgment is inappropriate.    Review of Systems  Constitutional: Negative.   HENT: Negative.    Eyes: Negative.   Respiratory: Negative.    Cardiovascular: Negative.   Gastrointestinal: Negative.   Musculoskeletal: Negative.   Skin: Negative.   Neurological: Negative.   Psychiatric/Behavioral:  Positive for hallucinations. Negative for depression, substance abuse and suicidal ideas. The patient is nervous/anxious and has insomnia.    Blood pressure (!) 123/95, pulse (!) 110, temperature 98.3 F (36.8 C), temperature source Oral, resp. rate 14, height '6\' 1"'$  (1.854 m), weight 75 kg, SpO2 100 %. Body mass index is 21.81 kg/m.  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  More concerning when she was brought to the emergency room she was apparently so agitated that she required restraint and forced medicine for safety.  This escalated things to the point that it seemed that inpatient admission was necessary.  Patient has calmed down a bit today  and does not appear to be acutely threatening but is still very nervous and paranoid.  I proposed to her restarting the olanzapine that she had taken previously and I was pleased that she agreed to this.  Continue 15-minute checks.  Monitor for any changes in behavior and any safety issues.  See if we can make sure she has appropriate follow-up at discharge  Observation Level/Precautions:  15 minute checks  Laboratory:  Chemistry Profile  Psychotherapy:    Medications:    Consultations:    Discharge Concerns:    Estimated LOS:  Other:     Physician Treatment Plan for Primary Diagnosis: Adjustment disorder with mixed disturbance of emotions and conduct Long Term Goal(s): Improvement in symptoms so as ready for discharge  Short Term Goals: Ability to demonstrate self-control will improve  Physician Treatment Plan for Secondary Diagnosis: Principal Problem:   Adjustment disorder with mixed disturbance of emotions and conduct Active Problems:   Delusional disorder ([place])   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    Alethia Berthold, MD 3/13/20245:19 PM

## 2023-02-05 NOTE — BHH Counselor (Signed)
Adult Comprehensive Assessment  Patient ID: Tricia Moran, female   DOB: [birth year], 37 y.o.   MRN: [id number]  Information Source: Information source: Patient  Current Stressors:  Patient states their primary concerns and needs for treatment are:: "have had a lot of pain in my head and back and it's made it difficult for life and someone recommended it" Patient states their goals for this hospitilization and ongoing recovery are:: "just want to go home" Educational / Learning stressors: Pt denies. Employment / Job issues: Pt denies. Family Relationships: Pt denies. Financial / Lack of resources (include bankruptcy): Pt denies. Housing / Lack of housing: Pt denies. Physical health (include injuries & life threatening diseases): Patient reports pain in head and back. Social relationships: Pt denies. Substance abuse: Pt denies. Bereavement / Loss: Pt denies.  Living/Environment/Situation:  Living Arrangements: Children Who else lives in the home?: Pt reports that she lives alone and has her daughter half of the time. How long has patient lived in current situation?: "a year" What is atmosphere in current home: Comfortable  Family History:  Marital status: Separated Separated, when?: "6 months" What types of issues is patient dealing with in the relationship?: Pt declined to state reasons for separation. Does patient have children?: Yes How many children?: 1 How is patient's relationship with their children?: Patient reports that her daughter is "amazing".  She reports that daughter is with daughters father at this time.  Childhood History:  By whom was/is the patient raised?: Both parents Description of patient's relationship with caregiver when they were a child: "really good" Patient's description of current relationship with people who raised him/her: "good" How were you disciplined when you got in trouble as a child/adolescent?: "told not to do it, if I did do I was sent to time  out" Does patient have siblings?: Yes Number of Siblings: 1 Description of patient's current relationship with siblings: Pt reports that she has a sister.  Patient identifies relationship as being "really good". Did patient suffer any verbal/emotional/physical/sexual abuse as a child?: No Did patient suffer from severe childhood neglect?: No Has patient ever been sexually abused/assaulted/raped as an adolescent or adult?: No Was the patient ever a victim of a crime or a disaster?: No Witnessed domestic violence?: No Has patient been affected by domestic violence as an adult?: No  Education:  Highest grade of school patient has completed: Unable to assess.  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 Patient's Job has Been Impacted by Current Illness: No What is the Longest Time Patient has Held a Job?: Current job  Where was the Patient Employed at that Time?: Teacher  Has Patient ever Been in the [employer]?: No  Financial Resources:   Financial resources: Income from employment, Private insurance Does patient have a representative payee or guardian?: No  Alcohol/Substance Abuse:   What has been your use of drugs/alcohol within the last 12 months?: Pt denies. If attempted suicide, did drugs/alcohol play a role in this?: No Alcohol/Substance Abuse Treatment Hx: Denies past history Has alcohol/substance abuse ever caused legal problems?: No  Social Support System:   Patient's Community Support System: Good Describe Community Support System: "my family and friends" Type of faith/religion: "Christianity" How does patient's faith help to cope with current illness?: "pray and go  to church"  Leisure/Recreation:   Do You  Have Hobbies?: Yes Leisure and Hobbies: "travel, photography and spending time with family and friends"  Strengths/Needs:   What is the patient's perception of their strengths?: "kind, creative, successful, greatful" Patient states they can use these personal strengths during their treatment to contribute to their recovery: Pt denies. Patient states these barriers may affect/interfere with their treatment: Pt denies. Patient states these barriers may affect their return to the community: Pt denies.  Discharge Plan:   Currently receiving community mental health services: No Patient states concerns and preferences for aftercare planning are: Pt reports that she is unsure if she would like a referral and plans to follow up on her own. Patient states they will know when they are safe and ready for discharge when: "I feel like right now I am honestly." Does patient have access to transportation?: Yes Does patient have financial barriers related to discharge medications?: No Will patient be returning to same living situation after discharge?: Yes  Summary/Recommendations:   Summary and Recommendations (to be completed by the evaluator): Patient is 37 year old separated female from [place], [place] [facility]).  Patient presents to the hospital with concerns for delusional disorder, somatic type.  Patient reportedly presented to [facility] for mental health, however, was recommended to come to the hospitals for concerns for paranoia and delusions.  Patient was reporting a belief that the [employer] were robots and not real.  She also reports that the [employer] had killed her family.  Patient reports that her triggers are ongoing pain in her head and in her back.  She reports frustration that no one is believing reports of her pain.  Patient reports that she does not have a current mental health provider and is not sure if she wants a provider.  She reports that if she does  get a provider she would like to identify her own provider.  Recommendations include: crisis stabilization, therapeutic milieu, encourage group attendance and participation, medication management for mood stabilization and development of comprehensive mental wellness/sobriety plan.  Rozann Lesches. 02/05/2023

## 2023-02-05 NOTE — BHH Suicide Risk Assessment (Signed)
[place] INPATIENT:  Family/Significant Other Suicide Prevention Education  Suicide Prevention Education:  Patient Refusal for Family/Significant Other Suicide Prevention Education: The patient Tricia Moran has refused to provide written consent for family/significant other to be provided Family/Significant Other Suicide Prevention Education during admission and/or prior to discharge.  Physician notified.  SPE completed with pt, as pt refused to consent to family contact. SPI pamphlet provided to pt and pt was encouraged to share information with support network, ask questions, and talk about any concerns relating to SPE. Pt denies access to guns/firearms and verbalized understanding of information provided. Mobile Crisis information also provided to pt.   Rozann Lesches 02/05/2023, 3:26 PM

## 2023-02-05 NOTE — Progress Notes (Signed)
NUTRITION ASSESSMENT  Pt identified as at risk on the Malnutrition Screen Tool  INTERVENTION:  -Ensure Enlive po TID, each supplement provides 350 kcal and 20 grams of protein -MVI with minerals daily  NUTRITION DIAGNOSIS: Unintentional weight loss related to sub-optimal intake as evidenced by pt report.   Goal: Pt to meet >/= 90% of their estimated nutrition needs.  Monitor:  PO intake  Assessment:  Pt admitted for psychosis. Pt is IVC.   Per assessment notes, pt complains of physical pain. She has had altered sleep and decreased appetite. She is hyperfocused on paranoid delusions. She is often refusing care.   Pt is currently on a regular diet. No meal completion data available at this time. Pt would benefit from addition of oral nutrition supplements.   Noted wt discrepancies over the past 24 hours. Per wt hx; pt has experienced a 4.3% wt loss over the past 3 months, which is not significant for time frame.   Labs reviewed: K: 3.2.   37 y.o. female  Height: Ht Readings from Last 1 Encounters:  02/04/23 '6\' 1"'$  (1.854 m)    Weight: Wt Readings from Last 1 Encounters:  02/04/23 75 kg    Weight Hx: Wt Readings from Last 10 Encounters:  02/04/23 75 kg  02/03/23 78.3 kg  11/12/22 78.3 kg  10/03/16 79.4 kg  09/08/15 72.6 kg  09/07/15 72.6 kg  09/06/15 72.6 kg  07/26/15 71.7 kg  07/20/15 68 kg    BMI:  Body mass index is 21.81 kg/m. BMI WDL.   Estimated Nutritional Needs: Kcal: 25-30 kcal/kg Protein: > 1 gram protein/kg Fluid: 1 ml/kcal  Diet Order:  Diet Order             Diet regular Room service appropriate? Yes; Fluid consistency: Thin  Diet effective now                  Pt is also offered choice of unit snacks mid-morning and mid-afternoon.  Pt is eating as desired.   Lab results and medications reviewed.   Loistine Chance, RD, LDN, [place] Registered Dietitian II Certified Diabetes Care and Education Specialist Please refer to [facility] for RD  and/or RD on-call/weekend/after hours pager

## 2023-02-05 NOTE — Progress Notes (Signed)
Patient visited by Husband this shift Husband wanted to know more information about Patient. No information was shared Per Chart The Patient Mother does not want information to be shared to the Husband.. Patient is calmer this shift compared to yesterday interacting well with Peers and Staff. Compliant with medications. Requested for Melatonin for sleep. Provider notified.   Patient in bed sleeping before getting the melatonin.Q 15 minutes safety checks ongoing. Support and encouragement provided.

## 2023-02-05 NOTE — Progress Notes (Signed)
Patient agitated screening C/O a peer wants to kill her. Continues to be suspicious and paranoid not able to sleep when in her room she is sitting in the corner "you are all recording me you are cloning me why you all want to kill me"  Patient encouraged PO medications and refused. Forced IM medications, ativan, benadryl and haldol given with show of support from security. Patient was reassured and Q 15 minutes safety checks ongoing. Patient remains safe.

## 2023-02-05 NOTE — Progress Notes (Signed)
Pt denies SI/HI/AVH and verbally agrees to approach staff if these become apparent or before harming themselves/others. Rates depression 0/10. Rates anxiety 0/10. Rates pain 0/10.  Pt has slept for most of the day, up until 1400. Pt has been calm and cooperative. Pt stated that a pt that claimed to be staff came into her room. Pt reassured that he was our SW and he is here to help her. Pt accepted the information. Pt has been out of her room since she has woken up. Pt asked about when group times were. Pt stated, "so we just chill the rest of the time?" Scheduled medications administered to pt, per MD orders. RN provided support and encouragement to pt. Q15 min safety checks implemented and continued. Pt is safe on the unit. Plan of care on going and no other concerns expressed at this time.  02/05/23 1400  Psych Admission Type (Psych Patients Only)  Admission Status Involuntary  Psychosocial Assessment  Patient Complaints None  Eye Contact Fair  Facial Expression Worried;Wide-eyed  Affect Fearful  Speech Soft  Interaction Forwards little  Motor Activity Slow  Appearance/Hygiene Unremarkable;In scrubs  Behavior Characteristics Cooperative;Appropriate to situation;Calm  Mood Suspicious  Aggressive Behavior  Effect No apparent injury  Thought Process  Coherency Disorganized  Content Paranoia  Delusions Paranoid  Perception WDL  Hallucination None reported or observed  Judgment Impaired  Confusion Mild  Danger to Self  Current suicidal ideation? Denies  Danger to Others  Danger to Others None reported or observed

## 2023-02-05 NOTE — Progress Notes (Signed)
Patient complain of not having a pulse asking other Patients and all Staff to check her Pulse. Patient refused for all her vital signs to be checked but her Pulse only. Patient is anxious at present. Prn medications offered and Patient refused Pulse checked 110. Patient was reassured. Support and encouragement provided.

## 2023-02-05 NOTE — BHH Suicide Risk Assessment (Signed)
[facility] Admission Suicide Risk Assessment   Nursing information obtained from:  Patient Demographic factors:  Divorced or widowed, Caucasian, Living alone Current Mental Status:  NA Loss Factors:  NA Historical Factors:  Family history of mental illness or substance abuse, Impulsivity Risk Reduction Factors:  Employed, Positive social support  Total Time spent with patient: 45 minutes Principal Problem: Adjustment disorder with mixed disturbance of emotions and conduct Diagnosis:  Principal Problem:   Adjustment disorder with mixed disturbance of emotions and conduct Active Problems:   Delusional disorder ([place])   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  37 year old woman sent here under involuntary commitment by [facility] after she presented there with delusions and agitation.  Patient denies suicidal thought and there is no evidence of any attempted self-harm.  No violent behavior or ideation.  Patient is preoccupied by somatic complaints and is also very anxious and paranoid in her behavior but ultimately agreeable to a recommended treatment plan.  Continued Clinical Symptoms:  Alcohol Use Disorder Identification Test Final Score (AUDIT): 0 The "Alcohol Use Disorders Identification Test", Guidelines for Use in Primary Care, Second Edition.  World Pharmacologist [facility]). Score between 0-7:  no or low risk or alcohol related problems. Score between 8-15:  moderate risk of alcohol related problems. Score between 16-19:  high risk of alcohol related problems. Score 20 or above:  warrants further diagnostic evaluation for alcohol dependence and treatment.   CLINICAL FACTORS:   Severe Anxiety and/or Agitation Currently Psychotic   Musculoskeletal: Strength & Muscle Tone: within normal limits Gait & Station: normal Patient leans: N/A  Psychiatric Specialty Exam:  Presentation  General Appearance: No data recorded Eye Contact:No data  recorded Speech:No data recorded Speech Volume:No data recorded Handedness:No data recorded  Mood and Affect  Mood:No data recorded Affect:No data recorded  Thought Process  Thought Processes:No data recorded Descriptions of Associations:No data recorded Orientation:No data recorded Thought Content:No data recorded History of Schizophrenia/Schizoaffective disorder:No  Duration of Psychotic Symptoms:Greater than six months  Hallucinations:No data recorded Ideas of Reference:No data recorded Suicidal Thoughts:No data recorded Homicidal Thoughts:No data recorded  Sensorium  Memory:No data recorded Judgment:No data recorded Insight:No data recorded  Executive Functions  Concentration:No data recorded Attention Span:No data recorded Recall:No data recorded Fund of Knowledge:No data recorded Language:No data recorded  Psychomotor Activity  Psychomotor Activity:No data recorded  Assets  Assets:No data recorded  Sleep  Sleep:No data recorded   Physical Exam: Physical Exam Vitals and nursing note reviewed.  Constitutional:      Appearance: Normal appearance.  HENT:     Head: Normocephalic and atraumatic.     Mouth/Throat:     Pharynx: Oropharynx is clear.  Eyes:     Pupils: Pupils are equal, round, and reactive to light.  Cardiovascular:     Rate and Rhythm: Normal rate and regular rhythm.  Pulmonary:     Effort: Pulmonary effort is normal.     Breath sounds: Normal breath sounds.  Abdominal:     General: Abdomen is flat.     Palpations: Abdomen is soft.  Musculoskeletal:        General: Normal range of motion.  Skin:    General: Skin is warm and dry.  Neurological:     General: No focal deficit present.     Mental Status: She is alert. Mental status is at baseline.  Psychiatric:        Attention and Perception: She is inattentive.  Mood and Affect: Mood is anxious. Affect is blunt.        Speech: Speech normal.        Behavior: Behavior is  agitated. Behavior is not aggressive.        Thought Content: Thought content is paranoid. Thought content does not include homicidal or suicidal ideation.        Cognition and Memory: Cognition normal.        Judgment: Judgment is inappropriate.    Review of Systems  Constitutional: Negative.   HENT: Negative.    Eyes: Negative.   Respiratory: Negative.    Cardiovascular: Negative.   Gastrointestinal: Negative.   Musculoskeletal: Negative.   Skin: Negative.   Neurological: Negative.   Psychiatric/Behavioral:  Positive for hallucinations. Negative for depression, substance abuse and suicidal ideas. The patient is nervous/anxious and has insomnia.    Blood pressure (!) 123/95, pulse (!) 110, temperature 98.3 F (36.8 C), temperature source Oral, resp. rate 14, height '6\' 1"'$  (1.854 m), weight 75 kg, SpO2 100 %. Body mass index is 21.81 kg/m.   COGNITIVE FEATURES THAT CONTRIBUTE TO RISK:  Closed-mindedness    SUICIDE RISK:   Minimal: No identifiable suicidal ideation.  Patients presenting with no risk factors but with morbid ruminations; may be classified as minimal risk based on the severity of the depressive symptoms  PLAN OF CARE: Recommend starting back on medication for psychotic symptoms and agitation.  Chart reviewed labs reviewed.  Continue 15-minute checks.  Ongoing assessment of dangerousness prior to discharge  I certify that inpatient services furnished can reasonably be expected to improve the patient's condition.   Alethia Berthold, MD 02/05/2023, 5:17 PM

## 2023-02-05 NOTE — Group Note (Signed)
LCSW Group Therapy Note   Group Date: 02/05/2023 Start Time: 1300 End Time: 1400   Type of Therapy and Topic:  Group Therapy: Boundaries  Participation Level:  Did Not Attend  Description of Group: This group will address the use of boundaries in their personal lives. Patients will explore why boundaries are important, the difference between healthy and unhealthy boundaries, and negative and postive outcomes of different boundaries and will look at how boundaries can be crossed.  Patients will be encouraged to identify current boundaries in their own lives and identify what kind of boundary is being set. Facilitators will guide patients in utilizing problem-solving interventions to address and correct types boundaries being used and to address when no boundary is being used. Understanding and applying boundaries will be explored and addressed for obtaining and maintaining a balanced life. Patients will be encouraged to explore ways to assertively make their boundaries and needs known to significant others in their lives, using other group members and facilitator for role play, support, and feedback.  Therapeutic Goals:  1.  Patient will identify areas in their life where setting clear boundaries could be  used to improve their life.  2.  Patient will identify signs/triggers that a boundary is not being respected. 3.  Patient will identify two ways to set boundaries in order to achieve balance in  their lives: 4.  Patient will demonstrate ability to communicate their needs and set boundaries  through discussion and/or role plays  Summary of Patient Progress:   X  Therapeutic Modalities:   Cognitive Behavioral Therapy Solution-Focused Therapy  Rozann Lesches, [place] 02/05/2023  2:07 PM

## 2023-02-06 DIAGNOSIS — F4325 Adjustment disorder with mixed disturbance of emotions and conduct: Secondary | ICD-10-CM | POA: Diagnosis not present

## 2023-02-06 MED ORDER — OLANZAPINE 5 MG PO TBDP
15.0000 mg | ORAL_TABLET | Freq: Every day | ORAL | Status: DC
  Administered 2023-02-06 – 2023-02-07 (×2): 15 mg via ORAL
  Filled 2023-02-06 (×2): qty 1

## 2023-02-06 NOTE — Group Note (Signed)
Recreation Therapy Group Note   Group Topic:Coping Skills  Group Date: 02/06/2023 Start Time: 1000 End Time: [id number] Facilitators: Vilma Prader, LRT, CTRS  Group Description: Mind Map.  Patient was provided a blank template of a diagram with 32 blank boxes in a tiered system, branching from the center (similar to a bubble chart). LRT directed patients to label the middle of the diagram "Coping Skills". LRT and patients then came up with 8 different coping skills as examples. Pt were directed to record their coping skills in the 2nd tier boxes closest to the center.  Patients would then share their coping skills with the group as LRT wrote them out. LRT gave a handout of 100 different coping skills at the end of group.    Affect/Mood: N/A   Participation Level: Did not attend    Clinical Observations/Individualized Feedback: [name] did not attend group due to resting in her room.  Plan: Continue to engage patient in RT group sessions 2-3x/week.   Vilma Prader, LRT, CTRS 02/06/2023 11:03 AM

## 2023-02-06 NOTE — Progress Notes (Signed)
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  Patient took the olanzapine last night.  Still did not sleep very well.  Still appears disheveled and withdrawn today.  Answers questions only minimally.  Patient's mother called today and requested a call back but when I asked the [place] patient she refused to give me consent.  Mother also brought by some FMLA paperwork which patient is requesting to see. 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 No pertinent past medical history. History reviewed. No pertinent surgical history. Family History: History reviewed. No pertinent family history. Family Psychiatric  History: See previous Social History:  Social History   Substance and Sexual Activity  Alcohol Use Yes     Social History   Substance and Sexual Activity  Drug Use Never    Social History   Socioeconomic History   Marital status: Married    Spouse name: Not on file   Number of children: Not on file   Years of education: Not on file   Highest education level: Not on file  Occupational History   Not on file  Tobacco Use   Smoking status: Never   Smokeless tobacco: Not on file  Substance and Sexual Activity   Alcohol use: Yes   Drug use: Never   Sexual activity: Not Currently  Other Topics Concern   Not on file  Social History Narrative   Not on file    Social Determinants of Health   Financial Resource Strain: Not on file  Food Insecurity: No Food Insecurity (02/05/2023)   Hunger Vital Sign    Worried About Running Out of Food in the Last Year: Never true    Ran Out of Food in the Last Year: Never true  Transportation Needs: Patient Declined (02/05/2023)   [place] - Hydrologist (Medical): Patient declined    Lack of Transportation (Non-Medical): Patient declined  Physical Activity: Not on file  Stress: Not on file  Social Connections: Not on file   Additional Social History:                         Sleep: Fair  Appetite:  Fair  Current Medications: Current Facility-Administered Medications  Medication Dose Route Frequency Provider Last Rate Last Admin   acetaminophen (TYLENOL) tablet 650 mg  650 mg Oral Q6H PRN Bennett, Christal H, NP       alum & mag hydroxide-simeth (MAALOX/MYLANTA) 200-200-20 MG/5ML suspension 30 mL  30 mL Oral Q4H PRN Bennett, Christal H, NP       feeding supplement (ENSURE ENLIVE / ENSURE PLUS) liquid 237 mL  237 mL Oral TID BM Taelon Bendorf T, MD   237 mL at 02/05/23 2100   magnesium hydroxide (MILK OF MAGNESIA) suspension 30 mL  30 mL Oral Daily PRN Bennett, Christal H, NP       melatonin tablet 10 mg  10 mg Oral QHS PRN Caroline Sauger, NP  multivitamin with minerals tablet 1 tablet  1 tablet Oral Daily Babe Anthis, Madie Reno, MD       OLANZapine zydis (ZYPREXA) disintegrating tablet 15 mg  15 mg Oral QHS Gavriella Hearst, Madie Reno, MD        Lab Results:  Results for orders placed or performed during the hospital encounter of 02/03/23 (from the past 48 hour(s))  Urine Drug Screen, Qualitative     Status: Abnormal   Collection Time: 02/04/23 12:47 PM  Result Value Ref Range   Tricyclic, Ur Screen POSITIVE (A) NONE DETECTED   Amphetamines, Ur Screen NONE DETECTED NONE DETECTED   MDMA (Ecstasy)Ur Screen NONE DETECTED NONE DETECTED   Cocaine Metabolite,Ur [place] NONE  DETECTED NONE DETECTED   Opiate, Ur Screen NONE DETECTED NONE DETECTED   Phencyclidine (PCP) Ur S NONE DETECTED NONE DETECTED   Cannabinoid 50 Ng, Ur [place] NONE DETECTED NONE DETECTED   Barbiturates, Ur Screen NONE DETECTED NONE DETECTED   Benzodiazepine, Ur Scrn POSITIVE (A) NONE DETECTED   Methadone Scn, Ur NONE DETECTED NONE DETECTED    Comment: (NOTE) Tricyclics + metabolites, urine    Cutoff 1000 ng/mL Amphetamines + metabolites, urine  Cutoff 1000 ng/mL MDMA (Ecstasy), urine              Cutoff 500 ng/mL Cocaine Metabolite, urine          Cutoff 300 ng/mL Opiate + metabolites, urine        Cutoff 300 ng/mL Phencyclidine (PCP), urine         Cutoff 25 ng/mL Cannabinoid, urine                 Cutoff 50 ng/mL Barbiturates + metabolites, urine  Cutoff 200 ng/mL Benzodiazepine, urine              Cutoff 200 ng/mL Methadone, urine                   Cutoff 300 ng/mL  The urine drug screen provides only a preliminary, unconfirmed analytical test result and should not be used for non-medical purposes. Clinical consideration and professional judgment should be applied to any positive drug screen result due to possible interfering substances. A more specific alternate chemical method must be used in order to obtain a confirmed analytical result. Gas chromatography / mass spectrometry (GC/MS) is the preferred confirm atory method. Performed at [facility], [address] [place], [place] [address]   POC urine preg, ED     Status: None   Collection Time: 02/04/23  1:46 PM  Result Value Ref Range   Preg Test, Ur Negative Negative    Blood Alcohol level:  Lab Results  Component Value Date   ETH <10 02/03/2023   ETH <5 123XX123    Metabolic Disorder Labs: Lab Results  Component Value Date   HGBA1C 5.0 09/08/2015   No results found for: "PROLACTIN" Lab Results  Component Value Date   CHOL 118 09/08/2015   TRIG 64 09/08/2015   HDL 55 09/08/2015   CHOLHDL 2.1  09/08/2015   VLDL 13 09/08/2015   LDLCALC 50 09/08/2015    Physical Findings: AIMS: Facial and Oral Movements Muscles of Facial Expression: None, normal Lips and Perioral Area: None, normal Jaw: None, normal Tongue: None, normal,Extremity Movements Upper (arms, wrists, hands, fingers): None, normal Lower (legs, knees, ankles, toes): None, normal, Trunk Movements Neck, shoulders, hips: None, normal, Overall Severity Severity of abnormal movements (highest score from questions above): None, normal Incapacitation due to abnormal  movements: None, normal Patient's awareness of abnormal movements (rate only patient's report): No Awareness, Dental Status Current problems with teeth and/or dentures?: No Does patient usually wear dentures?: No  CIWA:    COWS:     Musculoskeletal: Strength & Muscle Tone: within normal limits Gait & Station: normal Patient leans: N/A  Psychiatric Specialty Exam:  Presentation  General Appearance: No data recorded Eye Contact:No data recorded Speech:No data recorded Speech Volume:No data recorded Handedness:No data recorded  Mood and Affect  Mood:No data recorded Affect:No data recorded  Thought Process  Thought Processes:No data recorded Descriptions of Associations:No data recorded Orientation:No data recorded Thought Content:No data recorded History of Schizophrenia/Schizoaffective disorder:No  Duration of Psychotic Symptoms:Greater than six months  Hallucinations:No data recorded Ideas of Reference:No data recorded Suicidal Thoughts:No data recorded Homicidal Thoughts:No data recorded  Sensorium  Memory:No data recorded Judgment:No data recorded Insight:No data recorded  Executive Functions  Concentration:No data recorded Attention Span:No data recorded Recall:No data recorded Fund of Knowledge:No data recorded Language:No data recorded  Psychomotor Activity  Psychomotor Activity:No data recorded  Assets  Assets:No data  recorded  Sleep  Sleep:No data recorded   Physical Exam: Physical Exam Vitals and nursing note reviewed.  Constitutional:      Appearance: Normal appearance.  HENT:     Head: Normocephalic and atraumatic.     Mouth/Throat:     Pharynx: Oropharynx is clear.  Eyes:     Pupils: Pupils are equal, round, and reactive to light.  Cardiovascular:     Rate and Rhythm: Normal rate and regular rhythm.  Pulmonary:     Effort: Pulmonary effort is normal.     Breath sounds: Normal breath sounds.  Abdominal:     General: Abdomen is flat.     Palpations: Abdomen is soft.  Musculoskeletal:        General: Normal range of motion.  Skin:    General: Skin is warm and dry.  Neurological:     General: No focal deficit present.     Mental Status: She is alert. Mental status is at baseline.  Psychiatric:        Attention and Perception: She is inattentive.        Mood and Affect: Mood normal. Affect is blunt.        Speech: Speech is delayed.        Behavior: Behavior is withdrawn.        Thought Content: Thought content is paranoid.    Review of Systems  Constitutional:  Positive for malaise/fatigue.  Psychiatric/Behavioral:  The patient is nervous/anxious and has insomnia.    Blood pressure (!) 123/95, pulse (!) 110, temperature 98.3 F (36.8 C), temperature source Oral, resp. rate 14, height '6\' 1"'$  (1.854 m), weight 75 kg, SpO2 100 %. Body mass index is 21.81 kg/m.   Treatment Plan Summary: Plan continues to appear withdrawn somewhat disorganized and paranoid.  Increased dose of olanzapine to 15 mg tonight.  She had been on 20 in the past.  Patient will be allowed to review the paperwork her mother brought.  No other change in current treatment plan.  Alethia Berthold, MD 02/06/2023, 12:20 PM

## 2023-02-06 NOTE — Plan of Care (Signed)

## 2023-02-06 NOTE — Progress Notes (Signed)
Pt denies SI/HI/AVH and verbally agrees to approach staff if these become apparent or before harming themselves/others. Rates depression 2/10. Rates anxiety 2/10. Rates pain 0/10. Pt makes bizarre and paranoid comments at times. Pt seems to be slightly forgetful at times. Pt did not recall talking to SW or with MD about discharge. Pt stated that she wanted to leave the past in the past. Pt had asked multiple times about her plan for discharge. Pt has not been an issue. Scheduled medications administered to pt, per MD orders. RN provided support and encouragement to pt. Q15 min safety checks implemented and continued. Pt is safe on the unit. Plan of care on going and no other concerns expressed at this time.  02/06/23 0826  Psych Admission Type (Psych Patients Only)  Admission Status Involuntary  Psychosocial Assessment  Patient Complaints Anxiety;Depression  Eye Contact Fair  Facial Expression Anxious;Worried  Affect Fearful;Preoccupied  Occupational hygienist Activity Slow;Pacing  Appearance/Hygiene Unremarkable;In scrubs  Behavior Characteristics Pacing;Anxious;Cooperative;Appropriate to situation  Mood Anxious;Depressed;Fearful;Preoccupied;Pleasant  Aggressive Behavior  Effect No apparent injury  Thought Process  Coherency WDL  Content Paranoia  Delusions Paranoid  Perception WDL  Hallucination None reported or observed  Judgment Impaired  Confusion None  Danger to Self  Current suicidal ideation? Denies  Danger to Others  Danger to Others None reported or observed

## 2023-02-06 NOTE — [facility] Group Notes (Signed)
[facility] Group Notes:  (Nursing/MHT/Case Management/Adjunct)  Date:  02/06/2023  Time:  9:34 AM  Type of Therapy:   community meeting  Participation Level:  Did Not Attend    Antonieta Pert 02/06/2023, 9:34 AM

## 2023-02-06 NOTE — Progress Notes (Signed)
[facility]BC LCSW Progress Note   02/06/2023    1:17 PM  Tricia Moran   [id number]   Type of Contact and Topic:  PSA Attempt  CSW attempted to complete PSA with patient, CSW noticed in patient's room blankets attempted to block all light coming from window. When CSW asked patient if she would mind taking part in assessment, patient responded that the writer was attempted to steal her identity. Patient also stated she was concerned about walking past a computer brought to the room for assessment. Appears to be paranoid by writer's presence. CSW team will make additional attempt to complete PSA.      Signed:  Durenda Hurt, MSW, LCSW, LCAS 02/06/2023 1:17 PM

## 2023-02-07 DIAGNOSIS — F4325 Adjustment disorder with mixed disturbance of emotions and conduct: Secondary | ICD-10-CM | POA: Diagnosis not present

## 2023-02-07 NOTE — Progress Notes (Signed)
   02/07/23 0000  Psych Admission Type (Psych Patients Only)  Admission Status Involuntary  Psychosocial Assessment  Patient Complaints Depression  Eye Contact Fair  Facial Expression Anxious;Worried  Affect Anxious;Fearful;Preoccupied  Speech Rapid;Pressured  Interaction Needy  Motor Activity Pacing;Restless  Appearance/Hygiene Unremarkable  Behavior Characteristics Cooperative  Mood Depressed  Thought Process  Coherency Disorganized  Content Delusions;Paranoia  Delusions Paranoid  Perception WDL  Hallucination None reported or observed  Judgment Impaired  Confusion WDL  Danger to Self  Current suicidal ideation? Denies  Danger to Others  Danger to Others None reported or observed

## 2023-02-07 NOTE — Group Note (Signed)
Recreation Therapy Group Note   Group Topic:Emotion Expression  Group Date: 02/07/2023 Start Time: 1000 End Time: 1030 Facilitators: Vilma Prader, LRT, CTRS Location:  Craft Room  Group Description: Gratitude Journaling. Patients and LRT discussed what gratitude means, how we can express it and what it means to [place], personally. LRT gave an education handout on the definition of gratitude that also gave different examples of gratitude exercises that they could try. One of the examples was "Gratitude Letter", which prompted you to write a letter to someone you appreciate. LRT played soft music while everyone wrote their letter. Once letter was completed, LRT encouraged people to read their letter, if they wanted to, or share who they wrote it to, at minimum. LRT and pts processed how showing gratitude towards themselves, and others can be applied to everyday life post-discharge.   Affect/Mood: Appropriate and Flat   Participation Level: Active   Participation Quality: Independent   Behavior: Appropriate and Calm   Speech/Thought Process: Coherent   Insight: Fair   Judgement: Fair    Modes of Intervention: Activity and Guided Discussion   Patient Response to Interventions:  Disengaged   Education Outcome:  Acknowledges education   Clinical Observations/Individualized Feedback: Maycie was somewhat in their participation of session activities and group discussion. Pt wrote a few words on her paper before asking "will I get credit that I was here?" LRT replied "yes, however we will talk and discuss some things once everyone is finished working." Pt left the room and did not return.    Plan: Continue to engage patient in RT group sessions 2-3x/week.   Vilma Prader, LRT, CTRS 02/07/2023 10:43 AM

## 2023-02-07 NOTE — Progress Notes (Signed)
[facility] MD Progress Note  02/07/2023 1:11 PM Tricia Moran  MRN:  [id number] Subjective: Follow-up for this woman with history of paranoia.  Patient was requesting discharge today.  Continues to seem generally paranoid.  Believes that people in the hallway are following her around.  Believes that there is some kind of underhanded reason why she is being treated differently supposedly than other patients.  She did take her medication last night.  She denies any suicidal or homicidal thought.  Patient continued today to refuse to give me consent to speak to her family and to refused to give me to consent to complete her FMLA paperwork. 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 No pertinent past medical history. History reviewed. No pertinent surgical history. Family History: History reviewed. No pertinent family history. Family Psychiatric  History: See previous Social History:  Social History   Substance and Sexual Activity  Alcohol Use Yes     Social History   Substance and Sexual Activity  Drug Use Never    Social History   Socioeconomic History   Marital status: Married    Spouse name: Not on file   Number of children: Not on file   Years of education: Not on file   Highest education level: Not on file  Occupational History   Not on file  Tobacco Use   Smoking status: Never   Smokeless tobacco: Not on file  Substance and Sexual Activity   Alcohol use: Yes   Drug use: Never   Sexual activity: Not Currently  Other Topics Concern   Not on file   Social History Narrative   Not on file   Social Determinants of Health   Financial Resource Strain: Not on file  Food Insecurity: No Food Insecurity (02/05/2023)   Hunger Vital Sign    Worried About Running Out of Food in the Last Year: Never true    Ran Out of Food in the Last Year: Never true  Transportation Needs: Patient Declined (02/05/2023)   [facility] - Hydrologist (Medical): Patient declined    Lack of Transportation (Non-Medical): Patient declined  Physical Activity: Not on file  Stress: Not on file  Social Connections: Not on file   Additional Social History:                         Sleep: Fair  Appetite:  Fair  Current Medications: Current Facility-Administered Medications  Medication Dose Route Frequency Provider Last Rate Last Admin   acetaminophen (TYLENOL) tablet 650 mg  650 mg Oral Q6H PRN Bennett, Christal H, NP       alum & mag hydroxide-simeth (MAALOX/MYLANTA) 200-200-20 MG/5ML suspension 30 mL  30 mL Oral Q4H PRN Bennett, Christal H, NP       feeding supplement (ENSURE ENLIVE / ENSURE PLUS) liquid 237 mL  237 mL Oral TID BM Nicholaos Schippers T, MD   237 mL at 02/07/23 0900   magnesium hydroxide (MILK OF MAGNESIA) suspension 30 mL  30 mL Oral Daily PRN Bennett, Christal H, NP       melatonin tablet 10 mg  10 mg Oral QHS PRN Caroline Sauger, NP   10 mg at 02/06/23 2101   multivitamin with minerals tablet 1 tablet  1 tablet Oral Daily Kynisha Memon, Madie Reno, MD       OLANZapine zydis (ZYPREXA) disintegrating tablet 15 mg  15 mg Oral QHS Nichlas Pitera, Madie Reno, MD   15 mg at 02/06/23 2101    Lab Results: No results found for this or any previous visit (from the past 48 hour(s)).  Blood Alcohol level:  Lab Results  Component Value Date   ETH <10 02/03/2023   ETH <5 123XX123    Metabolic Disorder Labs: Lab Results  Component Value Date   HGBA1C 5.0 09/08/2015   No results found for: "PROLACTIN" Lab Results  Component Value  Date   CHOL 118 09/08/2015   TRIG 64 09/08/2015   HDL 55 09/08/2015   CHOLHDL 2.1 09/08/2015   VLDL 13 09/08/2015   LDLCALC 50 09/08/2015    Physical Findings: AIMS: Facial and Oral Movements Muscles of Facial Expression: None, normal Lips and Perioral Area: None, normal Jaw: None, normal Tongue: None, normal,Extremity Movements Upper (arms, wrists, hands, fingers): None, normal Lower (legs, knees, ankles, toes): None, normal, Trunk Movements Neck, shoulders, hips: None, normal, Overall Severity Severity of abnormal movements (highest score from questions above): None, normal Incapacitation due to abnormal movements: None, normal Patient's awareness of abnormal movements (rate only patient's report): No Awareness, Dental Status Current problems with teeth and/or dentures?: No Does patient usually wear dentures?: No  CIWA:    COWS:     Musculoskeletal: Strength & Muscle Tone: within normal limits Gait & Station: normal Patient leans: N/A  Psychiatric Specialty Exam:  Presentation  General Appearance: No data recorded Eye Contact:No data recorded Speech:No data recorded Speech Volume:No data recorded Handedness:No data recorded  Mood and Affect  Mood:No data recorded Affect:No data recorded  Thought Process  Thought Processes:No data recorded Descriptions of Associations:No data recorded Orientation:No data recorded Thought Content:No data recorded History of Schizophrenia/Schizoaffective disorder:No  Duration of Psychotic Symptoms:Greater than six months  Hallucinations:No data recorded Ideas of Reference:No data recorded Suicidal Thoughts:No data recorded Homicidal Thoughts:No data recorded  Sensorium  Memory:No data recorded Judgment:No data recorded Insight:No data recorded  Executive Functions  Concentration:No data recorded Attention Span:No data recorded Recall:No data recorded Fund of Knowledge:No data recorded Language:No data  recorded  Psychomotor Activity  Psychomotor Activity:No data recorded  Assets  Assets:No data recorded  Sleep  Sleep:No data recorded   Physical Exam: Physical Exam Vitals and nursing note reviewed.  Constitutional:      Appearance: Normal appearance.  HENT:     Head: Normocephalic and atraumatic.     Mouth/Throat:     Pharynx: Oropharynx is clear.  Eyes:     Pupils: Pupils are equal, round, and reactive to light.  Cardiovascular:     Rate and Rhythm: Normal rate and regular rhythm.  Pulmonary:     Effort: Pulmonary effort is normal.     Breath sounds: Normal breath sounds.  Abdominal:     General: Abdomen is flat.     Palpations: Abdomen is soft.  Musculoskeletal:        General: Normal range of motion.  Skin:    General: Skin is warm and dry.  Neurological:     General: No focal deficit present.     Mental Status: She is alert. Mental status is at baseline.  Psychiatric:        Attention and Perception: Attention normal.  Mood and Affect: Mood is anxious. Affect is blunt.        Speech: Speech is tangential.        Behavior: Behavior is withdrawn.        Thought Content: Thought content is paranoid.        Cognition and Memory: Cognition normal.        Judgment: Judgment is inappropriate.    Review of Systems  Constitutional: Negative.   HENT: Negative.    Eyes: Negative.   Respiratory: Negative.    Cardiovascular: Negative.   Gastrointestinal: Negative.   Musculoskeletal: Negative.   Skin: Negative.   Neurological: Negative.   Psychiatric/Behavioral:  Negative for depression, hallucinations, substance abuse and suicidal ideas. The patient is nervous/anxious.    Blood pressure 92/76, pulse (!) 101, temperature 97.6 F (36.4 C), temperature source Oral, resp. rate 18, height 6\' 1"  (1.854 m), weight 75 kg, SpO2 100 %. Body mass index is 21.81 kg/m.   Treatment Plan Summary: Medication management and Plan patient was insistent on wanting to know  when she would be discharged.  Eventually I told her that I could estimate that it might be as early as Monday.  Patient of course latched on to that and insisted that I was guaranteeing Monday causing me to have to backtrack and explained that this was not a guarantee.  All of the conversation revealed more paranoid outlook on things.  Patient does not appear ready yet for discharge.  Later in the afternoon she came back and told me that she would give me consent to speak to her mother on the telephone but still declined to give me consent to fill out her FMLA paperwork.  Alethia Berthold, MD 02/07/2023, 1:11 PM

## 2023-02-07 NOTE — Plan of Care (Signed)
D- Patient alert and oriented. Patient presented in a pleasant mood on assessment stating that she slept ok last night and had no complaints to voice to this Probation officer. Patient denied SI, HI, AVH, and pain at this time. Patient also denied any signs/symptoms of depression and anxiety, stating that "overall, I'm feeling ok". Patient had no stated goals for today.  A- Some scheduled medications administered to patient, per MD orders. Support and encouragement provided.  Routine safety checks conducted every 15 minutes.  Patient informed to notify staff with problems or concerns.  R- No adverse drug reactions noted. Patient contracts for safety at this time. Patient compliant with medications and treatment plan. Patient receptive, calm, and cooperative. Patient interacts well with others on the unit. Patient remains safe at this time.  Problem: Education: Goal: Knowledge of General Education information will improve Description: Including pain rating scale, medication(s)/side effects and non-pharmacologic comfort measures Outcome: Progressing   Problem: Health Behavior/Discharge Planning: Goal: Ability to manage health-related needs will improve Outcome: Progressing   Problem: Clinical Measurements: Goal: Ability to maintain clinical measurements within normal limits will improve Outcome: Progressing Goal: Will remain free from infection Outcome: Progressing Goal: Diagnostic test results will improve Outcome: Progressing Goal: Respiratory complications will improve Outcome: Progressing Goal: Cardiovascular complication will be avoided Outcome: Progressing   Problem: Activity: Goal: Risk for activity intolerance will decrease Outcome: Progressing   Problem: Nutrition: Goal: Adequate nutrition will be maintained Outcome: Progressing   Problem: Coping: Goal: Level of anxiety will decrease Outcome: Progressing   Problem: Elimination: Goal: Will not experience complications related to  bowel motility Outcome: Progressing Goal: Will not experience complications related to urinary retention Outcome: Progressing   Problem: Pain Managment: Goal: General experience of comfort will improve Outcome: Progressing   Problem: Safety: Goal: Ability to remain free from injury will improve Outcome: Progressing   Problem: Skin Integrity: Goal: Risk for impaired skin integrity will decrease Outcome: Progressing   Problem: Safety: Goal: Violent Restraint(s) Outcome: Progressing

## 2023-02-07 NOTE — Group Note (Signed)
LCSW Group Therapy Note  Group Date: 02/06/2023 Start Time: 1300 End Time: 1400   Type of Therapy and Topic:  Group Therapy - How To Cope with Nervousness about Discharge   Participation Level:  Active   Description of Group This process group involved identification of patients' feelings about discharge. Some of them are scheduled to be discharged soon, while others are new admissions, but each of them was asked to share thoughts and feelings surrounding discharge from the hospital. One common theme was that they are excited at the prospect of going home, while another was that many of them are apprehensive about sharing why they were hospitalized. Patients were given the opportunity to discuss these feelings with their peers in preparation for discharge.  Therapeutic Goals  Patient will identify their overall feelings about pending discharge. Patient will think about how they might proactively address issues that they believe will once again arise once they get home (i.e. with parents). Patients will participate in discussion about having hope for change.   Summary of Patient Progress:   Patient was present for the entirety of the group session. Patient was an active listener and participated in the topic of discussion, provided helpful advice to others, and added nuance to topic of conversation. Patient asked appropriate questions and helped develop understanding of a safe discharge plan.   Therapeutic Modalities Cognitive Behavioral Therapy   Tricia Moran 02/07/2023  2:28 PM

## 2023-02-08 DIAGNOSIS — F4325 Adjustment disorder with mixed disturbance of emotions and conduct: Secondary | ICD-10-CM | POA: Diagnosis not present

## 2023-02-08 LAB — LIPID PANEL
Cholesterol: 146 mg/dL (ref 0–200)
HDL: 54 mg/dL (ref >40)
LDL Cholesterol: 74 mg/dL (ref 0–99)
Total CHOL/HDL Ratio: 2.7 RATIO
Triglycerides: 92 mg/dL (ref <150)
VLDL: 18 mg/dL (ref 0–40)

## 2023-02-08 MED ORDER — LORAZEPAM 1 MG PO TABS: 1.0000 mg | ORAL_TABLET | ORAL | Status: DC | PRN

## 2023-02-08 MED ORDER — ZIPRASIDONE MESYLATE 20 MG IM SOLR: 20.0000 mg | INTRAMUSCULAR | Status: DC | PRN

## 2023-02-08 MED ORDER — HYDROXYZINE HCL 50 MG PO TABS: 50.0000 mg | ORAL_TABLET | Freq: Four times a day (QID) | ORAL | Status: DC | PRN

## 2023-02-08 MED ORDER — OLANZAPINE 5 MG PO TBDP: 5.0000 mg | ORAL_TABLET | Freq: Three times a day (TID) | ORAL | Status: DC | PRN

## 2023-02-08 MED ORDER — OLANZAPINE 10 MG PO TBDP
20.0000 mg | ORAL_TABLET | Freq: Every day | ORAL | Status: DC
  Administered 2023-02-08 – 2023-02-10 (×3): 20 mg via ORAL
  Filled 2023-02-08 (×3): qty 2

## 2023-02-08 NOTE — Progress Notes (Signed)
[facility] MD Progress Note  02/08/2023 12:52 PM Tricia Moran  MRN:  [id number] Subjective: Patient seen and chart reviewed.  Patient met with me today and seemed to be even more anxious and paranoid than yesterday.  She insisted on keeping the door to my office open although she claimed that that was simply because it was too hot in the room.  She questioned everything and would not accept any of my explanations.  Became more irritable and agitated the less I could satisfy her with my explanations of the goals of hospitalization.  Patient is apparently taking the olanzapine but does not seem much different than she was a couple days ago.  She continues to refuse to allow me to complete her FMLA paperwork. 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 No pertinent past medical history. History reviewed. No pertinent surgical history. Family History: History reviewed. No pertinent family history. Family Psychiatric  History: See previous Social History:  Social History   Substance and Sexual Activity  Alcohol Use Yes     Social History   Substance and Sexual Activity  Drug Use Never    Social History   Socioeconomic History   Marital status: Married    Spouse name: Not on file   Number of children: Not on file   Years of education: Not on file   Highest education level: Not on file  Occupational History   Not on file  Tobacco Use   Smoking status: Never   Smokeless tobacco: Not on file  Substance and Sexual Activity   Alcohol use: Yes   Drug use: Never    Sexual activity: Not Currently  Other Topics Concern   Not on file  Social History Narrative   Not on file   Social Determinants of Health   Financial Resource Strain: Not on file  Food Insecurity: No Food Insecurity (02/05/2023)   Hunger Vital Sign    Worried About Running Out of Food in the Last Year: Never true    Ran Out of Food in the Last Year: Never true  Transportation Needs: Patient Declined (02/05/2023)   PRAPARE - Hydrologist (Medical): Patient declined    Lack of Transportation (Non-Medical): Patient declined  Physical Activity: Not on file  Stress: Not on file  Social Connections: Not on file   Additional Social History:                         Sleep: Fair  Appetite:  Fair  Current Medications: Current Facility-Administered Medications  Medication Dose Route Frequency Provider Last Rate Last Admin   acetaminophen (TYLENOL) tablet 650 mg  650 mg Oral Q6H PRN Bennett, Christal H, NP       alum & mag hydroxide-simeth (MAALOX/MYLANTA) 200-200-20 MG/5ML suspension 30 mL  30 mL Oral Q4H PRN Bennett, Christal H, NP       hydrOXYzine (ATARAX) tablet 50 mg  50 mg Oral Q6H PRN Kenshin Splawn T, MD       OLANZapine zydis (ZYPREXA) disintegrating tablet 5 mg  5 mg Oral Q8H PRN Jazsmine Macari  T, MD       And   LORazepam (ATIVAN) tablet 1 mg  1 mg Oral PRN Yalissa Fink, Madie Reno, MD       And   ziprasidone (GEODON) injection 20 mg  20 mg Intramuscular PRN Briella Hobday, Madie Reno, MD       magnesium hydroxide (MILK OF MAGNESIA) suspension 30 mL  30 mL Oral Daily PRN Bennett, Christal H, NP       melatonin tablet 10 mg  10 mg Oral QHS PRN Caroline Sauger, NP   10 mg at 02/07/23 2246   multivitamin with minerals tablet 1 tablet  1 tablet Oral Daily Ryver Zadrozny, Madie Reno, MD       OLANZapine zydis (ZYPREXA) disintegrating tablet 20 mg  20 mg Oral QHS Adri Schloss, Madie Reno, MD        Lab Results:  Results for orders placed or performed during the hospital  encounter of 02/04/23 (from the past 48 hour(s))  Lipid panel     Status: None   Collection Time: 02/08/23 11:44 AM  Result Value Ref Range   Cholesterol 146 0 - 200 mg/dL   Triglycerides 92 <150 mg/dL   HDL 54 >40 mg/dL   Total CHOL/HDL Ratio 2.7 RATIO   VLDL 18 0 - 40 mg/dL   LDL Cholesterol 74 0 - 99 mg/dL    Comment:        Total Cholesterol/HDL:CHD Risk Coronary Heart Disease Risk Table                     Men   Women  1/2 Average Risk   3.4   3.3  Average Risk       5.0   4.4  2 X Average Risk   9.6   7.1  3 X Average Risk  23.4   11.0        Use the calculated Patient Ratio above and the CHD Risk Table to determine the patient's CHD Risk.        ATP III CLASSIFICATION (LDL):  <100     mg/dL   Optimal  100-129  mg/dL   Near or Above                    Optimal  130-159  mg/dL   Borderline  160-189  mg/dL   High  >190     mg/dL   Very High Performed at [facility], [address] [place], [place] [address]     Blood Alcohol level:  Lab Results  Component Value Date   [facility] <10 02/03/2023   ETH <5 [id number]    Metabolic Disorder Labs: Lab Results  Component Value Date   HGBA1C 5.0 09/08/2015   No results found for: "PROLACTIN" Lab Results  Component Value Date   CHOL 146 02/08/2023   TRIG 92 02/08/2023   HDL 54 02/08/2023   CHOLHDL 2.7 02/08/2023   VLDL 18 02/08/2023   LDLCALC 74 02/08/2023   LDLCALC 50 09/08/2015    Physical Findings: AIMS: Facial and Oral Movements Muscles of Facial Expression: None, normal Lips and Perioral Area: None, normal Jaw: None, normal Tongue: None, normal,Extremity Movements Upper (arms, wrists, hands, fingers): None, normal Lower (legs, knees, ankles, toes): None, normal, Trunk Movements Neck, shoulders, hips: None, normal, Overall Severity Severity of abnormal movements (highest score from questions above): None, normal Incapacitation due to abnormal movements: None, normal Patient's awareness of  abnormal movements (rate only patient's report): No Awareness, Dental  Status Current problems with teeth and/or dentures?: No Does patient usually wear dentures?: No  CIWA:    COWS:     Musculoskeletal: Strength & Muscle Tone: within normal limits Gait & Station: normal Patient leans: N/A  Psychiatric Specialty Exam:  Presentation  General Appearance: No data recorded Eye Contact:No data recorded Speech:No data recorded Speech Volume:No data recorded Handedness:No data recorded  Mood and Affect  Mood:No data recorded Affect:No data recorded  Thought Process  Thought Processes:No data recorded Descriptions of Associations:No data recorded Orientation:No data recorded Thought Content:No data recorded History of Schizophrenia/Schizoaffective disorder:No  Duration of Psychotic Symptoms:Greater than six months  Hallucinations:No data recorded Ideas of Reference:No data recorded Suicidal Thoughts:No data recorded Homicidal Thoughts:No data recorded  Sensorium  Memory:No data recorded Judgment:No data recorded Insight:No data recorded  Executive Functions  Concentration:No data recorded Attention Span:No data recorded Recall:No data recorded Fund of Knowledge:No data recorded Language:No data recorded  Psychomotor Activity  Psychomotor Activity:No data recorded  Assets  Assets:No data recorded  Sleep  Sleep:No data recorded   Physical Exam: Physical Exam Vitals and nursing note reviewed.  Constitutional:      Appearance: Normal appearance.  HENT:     Head: Normocephalic and atraumatic.     Mouth/Throat:     Pharynx: Oropharynx is clear.  Eyes:     Pupils: Pupils are equal, round, and reactive to light.  Cardiovascular:     Rate and Rhythm: Normal rate and regular rhythm.  Pulmonary:     Effort: Pulmonary effort is normal.     Breath sounds: Normal breath sounds.  Abdominal:     General: Abdomen is flat.     Palpations: Abdomen is soft.   Musculoskeletal:        General: Normal range of motion.  Skin:    General: Skin is warm and dry.  Neurological:     General: No focal deficit present.     Mental Status: She is alert. Mental status is at baseline.  Psychiatric:        Attention and Perception: She is inattentive.        Mood and Affect: Mood is anxious. Affect is blunt and inappropriate.        Speech: Speech is tangential.        Behavior: Behavior is agitated. Behavior is not aggressive.        Thought Content: Thought content is paranoid.        Judgment: Judgment is inappropriate.    Review of Systems  Constitutional: Negative.   HENT: Negative.    Eyes: Negative.   Respiratory: Negative.    Cardiovascular: Negative.   Gastrointestinal: Negative.   Musculoskeletal: Negative.   Skin: Negative.   Neurological: Negative.   Psychiatric/Behavioral:  Negative for depression, hallucinations, memory loss, substance abuse and suicidal ideas. The patient has insomnia. The patient is not nervous/anxious.    Blood pressure (!) 132/92, pulse (!) 133, temperature (!) 97.5 F (36.4 C), temperature source Oral, resp. rate 18, height 6\' 1"  (1.854 m), weight 75 kg, SpO2 96 %. Body mass index is 21.81 kg/m.   Treatment Plan Summary: Medication management and Plan increase olanzapine up to 20 mg a day which was the dose she took when she was here on her previous admission.  She certainly does not seem over sedated.  Patient was oddly questioning to the point of being paranoid why we were checking her hemoglobin A1c and lipid panel.  I explained all of that to her.  Not  sure that satisfied her.  I pointed out to her that if I filled out her FMLA paperwork that would probably be very helpful for her as far as her employment.  She continues to refuse to allow me to do so.  In fact she demanded that I handed over the paperwork to her but I am not going to do that because she is not the one who gave me the paperwork in the first  place.  I will set it aside and not complete it at this point.  Alethia Berthold, MD 02/08/2023, 12:52 PM

## 2023-02-08 NOTE — Plan of Care (Signed)

## 2023-02-08 NOTE — Progress Notes (Signed)
Pt was calm and cooperative during shift.  Denies SI HI AVH. Compliant with medications.  Continued monitoring for safety.  02/08/23 0000  Psych Admission Type (Psych Patients Only)  Admission Status Involuntary  Psychosocial Assessment  Patient Complaints None  Eye Contact Fair  Facial Expression Worried  Affect Appropriate to circumstance  Speech Soft  Interaction Minimal  Motor Activity Slow  Appearance/Hygiene Unremarkable  Behavior Characteristics Cooperative  Mood Pleasant  Thought Process  Coherency WDL  Content WDL  Delusions None reported or observed  Perception WDL  Hallucination None reported or observed  Judgment WDL  Confusion None  Danger to Self  Current suicidal ideation? Denies  Danger to Others  Danger to Others None reported or observed

## 2023-02-08 NOTE — BHH Counselor (Signed)
CSW met with the patient at nurses request.  Patient stated that she just wanted to make CSW aware that physician has indicated that she may be discharged on Monday.   Assunta Curtis, MSW, LCSW 02/08/2023 2:21 PM

## 2023-02-08 NOTE — Group Note (Signed)
[facility] LCSW Group Therapy Note   Group Date: 02/08/2023 Start Time: [id number] End Time: 1411   Type of Therapy/Topic:  Group Therapy:  Emotion Regulation  Participation Level:  Active   Mood:  Description of Group:    The purpose of this group is to assist patients in learning to regulate negative emotions and experience positive emotions. Patients will be guided to discuss ways in which they have been vulnerable to their negative emotions. These vulnerabilities will be juxtaposed with experiences of positive emotions or situations, and patients challenged to use positive emotions to combat negative ones. Special emphasis will be placed on coping with negative emotions in conflict situations, and patients will process healthy conflict resolution skills.  Therapeutic Goals: Patient will identify two positive emotions or experiences to reflect on in order to balance out negative emotions:  Patient will label two or more emotions that they find the most difficult to experience:  Patient will be able to demonstrate positive conflict resolution skills through discussion or role plays:   Summary of Patient Progress: Patient was present in group.  Patient stated that she thinks of "controlling your emotions" when she thinks of emotional regulation. She was able to identify that our body is what alerts [place] when we are experiencing emotions.  Patient was receptive and supportive of other group members.  Patient's insight appeared fair.  Patient was able to identify her peers as her support system and her most used coping mechanism.    Therapeutic Modalities:   Cognitive Behavioral Therapy Feelings Identification Dialectical Behavioral Therapy   Rozann Lesches, LCSW

## 2023-02-08 NOTE — Progress Notes (Addendum)
Pt denies SI/HI/AVH and verbally agrees to approach staff if these become apparent or before harming themselves/others. Rates depression 0/10. Rates anxiety 0/10. Rates pain 0/10. Pt was in her room for the majority of this morning but started pacing quickly in the afternoon. Pt will walk with her arms crossed and tight to body. Writer was walking behind and to the side of pt to go into another pts room, pt looked back multiple times and then somewhat freaked out and went the other way. Pt has no insight to her paranoia and is preoccupied on d/c. Pt has been on the phone multiple times. Scheduled medications administered to pt, per MD orders. RN provided support and encouragement to pt. Q15 min safety checks implemented and continued. Pt is safe on the unit. Plan of care on going and no other concerns expressed at this time.  02/08/23 0730  Psych Admission Type (Psych Patients Only)  Admission Status Involuntary  Psychosocial Assessment  Patient Complaints None  Eye Contact Suspiciousness  Facial Expression Worried;Sad  Affect Anxious;Preoccupied  Speech Soft;Rapid  Interaction Guarded;Minimal;Cautious  Motor Activity Pacing  Appearance/Hygiene Unremarkable  Behavior Characteristics Cooperative;Appropriate to situation;Pacing;Guarded  Mood Pleasant;Preoccupied;Suspicious  Aggressive Behavior  Effect No apparent injury  Thought Process  Coherency WDL  Content Paranoia;Preoccupation  Delusions Paranoid  Perception WDL  Hallucination None reported or observed  Judgment Impaired  Confusion None  Danger to Self  Current suicidal ideation? Denies  Danger to Others  Danger to Others None reported or observed

## 2023-02-08 NOTE — Group Note (Signed)
AA/NA Group     Group Date: 02/08/2023 Start Time: [id number] End Time: 1445     Type of Therapy and Topic:  Group Therapy:    Participation Level:  Did Not Attend   Description of Group: AA/NA providers held group with patients to address SUD.   Summary of Patient Progress:     X   Therapeutic Modalities:    Maryjane Hurter 02/08/2023  2:53 PM

## 2023-02-09 DIAGNOSIS — F4325 Adjustment disorder with mixed disturbance of emotions and conduct: Secondary | ICD-10-CM | POA: Diagnosis not present

## 2023-02-09 NOTE — Plan of Care (Signed)
Patient stayed in bed most of the shift. Patient refused to take multivitamin this morning states " I don't take any medicines in the morning." Patient kind of guarded and does not want to elaborate on any questions.States " I am ok." Patient does not want to go outside even with prompting. Denies SI,HI and AVH. Appetite and energy level good. Support and encouragement given.

## 2023-02-09 NOTE — B[facility] Group Notes (Signed)
[facility] Group Notes:  (Nursing/MHT/Case Management/Adjunct)  Date:  02/09/2023  Time:  8:58 PM  Type of Therapy:   Wrap up  Participation Level:  Active  Participation Quality:  Appropriate  Affect:  Appropriate  Cognitive:  Alert  Insight:  Good  Engagement in Group:  Engaged and goal is to get out of here and is to take her medicine.  Modes of Intervention:  Support  Summary of Progress/Problems:  Tricia Moran 02/09/2023, 8:58 PM

## 2023-02-09 NOTE — Progress Notes (Signed)
[facility] MD Progress Note  02/09/2023 12:54 PM Tricia Moran  MRN:  [id number] Subjective: Patient seen for follow-up.  37 year old woman with history of delusional disorder and recent worsening paranoia.  Patient did not have any new complaints today.  At least earlier today when I spoke with her seemed more relaxed.  Not making any paranoid statements.  Still reserved and suspicious.  No evidence of suicidal thinking. 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 No pertinent past medical history. History reviewed. No pertinent surgical history. Family History: History reviewed. No pertinent family history. Family Psychiatric  History: See previous Social History:  Social History   Substance and Sexual Activity  Alcohol Use Yes     Social History   Substance and Sexual Activity  Drug Use Never    Social History   Socioeconomic History   Marital status: Married    Spouse name: Not on file   Number of children: Not on file   Years of education: Not on file   Highest education level: Not on file  Occupational History   Not on file  Tobacco Use   Smoking status: Never   Smokeless tobacco: Not on file  Substance and Sexual Activity   Alcohol use: Yes   Drug use: Never   Sexual activity: Not Currently  Other Topics Concern   Not on file  Social History Narrative   Not on file   Social Determinants of Health   Financial Resource Strain: Not on file  Food Insecurity: No Food Insecurity (02/05/2023)   Hunger Vital Sign    Worried About Running Out of Food in the Last Year: Never true    Ran Out of  Food in the Last Year: Never true  Transportation Needs: Patient Declined (02/05/2023)   PRAPARE - Hydrologist (Medical): Patient declined    Lack of Transportation (Non-Medical): Patient declined  Physical Activity: Not on file  Stress: Not on file  Social Connections: Not on file   Additional Social History:                         Sleep: Fair  Appetite:  Fair  Current Medications: Current Facility-Administered Medications  Medication Dose Route Frequency Provider Last Rate Last Admin   acetaminophen (TYLENOL) tablet 650 mg  650 mg Oral Q6H PRN Bennett, Christal H, NP       alum & mag hydroxide-simeth (MAALOX/MYLANTA) 200-200-20 MG/5ML suspension 30 mL  30 mL Oral Q4H PRN Bennett, Christal H, NP       hydrOXYzine (ATARAX) tablet 50 mg  50 mg Oral Q6H PRN Saagar Tortorella T, MD       OLANZapine zydis (ZYPREXA) disintegrating tablet 5 mg  5 mg Oral Q8H PRN Scout Guyett T, MD       And   LORazepam (ATIVAN) tablet 1 mg  1 mg Oral PRN Randall Colden T, MD       And   ziprasidone (GEODON) injection 20 mg  20 mg Intramuscular PRN Kambrey Hagger T, MD       magnesium hydroxide (MILK OF MAGNESIA) suspension 30 mL  30 mL Oral  Daily PRN Richardson Landry, Christal H, NP       melatonin tablet 10 mg  10 mg Oral QHS PRN Caroline Sauger, NP   10 mg at 02/08/23 2119   multivitamin with minerals tablet 1 tablet  1 tablet Oral Daily Tashema Tiller, Madie Reno, MD       OLANZapine zydis (ZYPREXA) disintegrating tablet 20 mg  20 mg Oral QHS Charlotta Lapaglia, Madie Reno, MD   20 mg at 02/08/23 2119    Lab Results:  Results for orders placed or performed during the hospital encounter of 02/04/23 (from the past 48 hour(s))  Lipid panel     Status: None   Collection Time: 02/08/23 11:44 AM  Result Value Ref Range   Cholesterol 146 0 - 200 mg/dL   Triglycerides 92 <150 mg/dL   HDL 54 >40 mg/dL   Total CHOL/HDL Ratio 2.7 RATIO   VLDL 18 0 - 40 mg/dL   LDL Cholesterol 74 0 - 99 mg/dL     Comment:        Total Cholesterol/HDL:CHD Risk Coronary Heart Disease Risk Table                     Men   Women  1/2 Average Risk   3.4   3.3  Average Risk       5.0   4.4  2 X Average Risk   9.6   7.1  3 X Average Risk  23.4   11.0        Use the calculated Patient Ratio above and the CHD Risk Table to determine the patient's CHD Risk.        ATP III CLASSIFICATION (LDL):  <100     mg/dL   Optimal  100-129  mg/dL   Near or Above                    Optimal  130-159  mg/dL   Borderline  160-189  mg/dL   High  >190     mg/dL   Very High Performed at [facility], [address] [place], [place] [address]     Blood Alcohol level:  Lab Results  Component Value Date   [facility] <10 02/03/2023   ETH <5 123XX123    Metabolic Disorder Labs: Lab Results  Component Value Date   HGBA1C 5.0 09/08/2015   No results found for: "PROLACTIN" Lab Results  Component Value Date   CHOL 146 02/08/2023   TRIG 92 02/08/2023   HDL 54 02/08/2023   CHOLHDL 2.7 02/08/2023   VLDL 18 02/08/2023   LDLCALC 74 02/08/2023   LDLCALC 50 09/08/2015    Physical Findings: AIMS: Facial and Oral Movements Muscles of Facial Expression: None, normal Lips and Perioral Area: None, normal Jaw: None, normal Tongue: None, normal,Extremity Movements Upper (arms, wrists, hands, fingers): None, normal Lower (legs, knees, ankles, toes): None, normal, Trunk Movements Neck, shoulders, hips: None, normal, Overall Severity Severity of abnormal movements (highest score from questions above): None, normal Incapacitation due to abnormal movements: None, normal Patient's awareness of abnormal movements (rate only patient's report): No Awareness, Dental Status Current problems with teeth and/or dentures?: No Does patient usually wear dentures?: No  CIWA:    COWS:     Musculoskeletal: Strength & Muscle Tone: within normal limits Gait & Station: normal Patient leans: N/A  Psychiatric Specialty  Exam:  Presentation  General Appearance: No data recorded Eye Contact:No data recorded Speech:No data recorded Speech Volume:No data recorded Handedness:No  data recorded  Mood and Affect  Mood:No data recorded Affect:No data recorded  Thought Process  Thought Processes:No data recorded Descriptions of Associations:No data recorded Orientation:No data recorded Thought Content:No data recorded History of Schizophrenia/Schizoaffective disorder:No  Duration of Psychotic Symptoms:Greater than six months  Hallucinations:No data recorded Ideas of Reference:No data recorded Suicidal Thoughts:No data recorded Homicidal Thoughts:No data recorded  Sensorium  Memory:No data recorded Judgment:No data recorded Insight:No data recorded  Executive Functions  Concentration:No data recorded Attention Span:No data recorded Recall:No data recorded [employer] [place] recorded Language:No data recorded  Psychomotor Activity  Psychomotor Activity:No data recorded  Assets  Assets:No data recorded  Sleep  Sleep:No data recorded   Physical Exam: Physical Exam Vitals reviewed.  Constitutional:      Appearance: Normal appearance.  HENT:     Head: Normocephalic and atraumatic.     Mouth/Throat:     Pharynx: Oropharynx is clear.  Eyes:     Pupils: Pupils are equal, round, and reactive to light.  Cardiovascular:     Rate and Rhythm: Normal rate and regular rhythm.  Pulmonary:     Effort: Pulmonary effort is normal.     Breath sounds: Normal breath sounds.  Abdominal:     General: Abdomen is flat.     Palpations: Abdomen is soft.  Musculoskeletal:        General: Normal range of motion.  Skin:    General: Skin is warm and dry.  Neurological:     General: No focal deficit present.     Mental Status: She is alert. Mental status is at baseline.  Psychiatric:        Attention and Perception: Attention normal.        Mood and Affect: Mood normal. Affect is blunt.         Speech: Speech is delayed.        Behavior: Behavior is slowed.        Thought Content: Thought content is paranoid.    Review of Systems  Constitutional: Negative.   HENT: Negative.    Eyes: Negative.   Respiratory: Negative.    Cardiovascular: Negative.   Gastrointestinal: Negative.   Musculoskeletal: Negative.   Skin: Negative.   Neurological: Negative.   Psychiatric/Behavioral:  The patient is nervous/anxious.    Blood pressure (!) 123/98, pulse 74, temperature 98 F (36.7 C), temperature source Oral, resp. rate 19, height 6\' 1"  (1.854 m), weight 75 kg, SpO2 95 %. Body mass index is 21.81 kg/m.   Treatment Plan Summary: Medication management and Plan no change to medication.  Supportive counseling and encouragement.  Patient was able to take the FMLA papers yesterday without getting too paranoid about them.  Continue to encourage her to be compliant with medicine.  Alethia Berthold, MD 02/09/2023, 12:54 PM

## 2023-02-09 NOTE — Progress Notes (Signed)
Pt was calm and cooperative; paranoid and anxious at times.  Pt became anxious when an overhead announcement was made about another area of the hospital.  Pt stated she needed to sit down and calm down, then returned to bed.  Denies SI HI AVH.  02/09/23 0100  Psych Admission Type (Psych Patients Only)  Admission Status Involuntary  Psychosocial Assessment  Patient Complaints None  Eye Contact Suspiciousness  Facial Expression Worried  Affect Anxious  Speech Soft  Interaction Guarded;Cautious  Motor Activity Pacing  Appearance/Hygiene Unremarkable  Behavior Characteristics Cooperative  Mood Preoccupied  Thought Process  Coherency WDL  Content Paranoia  Delusions Paranoid  Perception WDL  Hallucination None reported or observed  Judgment Impaired  Confusion None  Danger to Self  Current suicidal ideation? Denies  Danger to Others  Danger to Others None reported or observed

## 2023-02-10 DIAGNOSIS — F4325 Adjustment disorder with mixed disturbance of emotions and conduct: Secondary | ICD-10-CM | POA: Diagnosis not present

## 2023-02-10 LAB — HEMOGLOBIN A1C
Hgb A1c MFr Bld: 4.8 % (ref 4.8–5.6)
Mean Plasma Glucose: 91 mg/dL

## 2023-02-10 NOTE — [facility] Group Notes (Signed)
[place] Group Notes:  (Nursing/MHT/Case Management/Adjunct)  Date:  02/10/2023  Time:  9:46 AM  Type of Therapy:   Community Meeting  Participation Level:  Did Not Attend   Tricia Moran [facility] 02/10/2023, 9:46 AM

## 2023-02-10 NOTE — Group Note (Signed)
Recreation Therapy Group Note   Group Topic:Goal Setting  Group Date: 02/10/2023 Start Time: 1000 End Time: 1045 Facilitators: Vilma Prader, LRT, CTRS Location:  Craft Room  Group Description: Scientist, physiological. Patients were given many different magazines, a glue stick, markers, and a piece of cardstock paper. LRT and pts discussed the importance of having goals in life. LRT and pts discussed the difference between short-term and long-term goals. LRT encouraged pts to create a vision board, with images they picked and then cut out by LRT from the magazine, for themselves, that capture their short and long-term goals. On the back of the paper, pt encouraged to write 3 different coping skills that can help them reach those goals. LRT encouraged pts to show and explain their vision board to the group once complete. LRT offered to laminate vision board once dry and complete.   Affect/Mood: N/A   Participation Level: Did not attend    Clinical Observations/Individualized Feedback: Tricia Moran did not attend group due to resting in her room.  Plan: Continue to engage patient in RT group sessions 2-3x/week.   Vilma Prader, LRT, CTRS 02/10/2023 11:03 AM

## 2023-02-10 NOTE — Plan of Care (Signed)
Patient isolates to her room and minimal interactions with staff and peers. Notice patient pacing in the unit. Patient states " I came to look for the doctor whether I can go home today? " Patient stated that she does not need anything for anxiety now. Patient denies SI,HI and AVH. Appetite and energy level good. Support and encouragement given.

## 2023-02-10 NOTE — Progress Notes (Signed)
[facility] MD Progress Note  02/10/2023 4:08 PM Tricia Moran  MRN:  [id number] Subjective: Patient seen and chart reviewed.  Patient still comes across as being anxious and overly worried and defensive but has not said or done anything dangerous.  She is taking care of her ADLs adequately.  Does not appear to be in the grip of an obvious delusion. 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 No pertinent past medical history. History reviewed. No pertinent surgical history. Family History: History reviewed. No pertinent family history. Family Psychiatric  History: See previous Social History:  Social History   Substance and Sexual Activity  Alcohol Use Yes     Social History   Substance and Sexual Activity  Drug Use Never    Social History   Socioeconomic History   Marital status: Married    Spouse name: Not on file   Number of children: Not on file   Years of education: Not on file   Highest education level: Not on file  Occupational History   Not on file  Tobacco Use   Smoking status: Never   Smokeless tobacco: Not on file  Substance and Sexual Activity   Alcohol use: Yes   Drug use: Never   Sexual activity: Not Currently  Other Topics Concern   Not on file  Social History Narrative   Not on file   Social Determinants of Health   Financial Resource Strain: Not on file  Food Insecurity: No Food Insecurity (02/05/2023)   Hunger Vital Sign    Worried About Running Out of Food in the Last Year: Never true    Ran Out of Food in the Last Year: Never true  Transportation Needs: Patient Declined  (02/05/2023)   PRAPARE - Hydrologist (Medical): Patient declined    Lack of Transportation (Non-Medical): Patient declined  Physical Activity: Not on file  Stress: Not on file  Social Connections: Not on file   Additional Social History:                         Sleep: Fair  Appetite:  Fair  Current Medications: Current Facility-Administered Medications  Medication Dose Route Frequency Provider Last Rate Last Admin   acetaminophen (TYLENOL) tablet 650 mg  650 mg Oral Q6H PRN Bennett, Christal H, NP       alum & mag hydroxide-simeth (MAALOX/MYLANTA) 200-200-20 MG/5ML suspension 30 mL  30 mL Oral Q4H PRN Bennett, Christal H, NP       hydrOXYzine (ATARAX) tablet 50 mg  50 mg Oral Q6H PRN Musab Wingard T, MD       OLANZapine zydis (ZYPREXA) disintegrating tablet 5 mg  5 mg Oral Q8H PRN [place] Spainhower T, MD       And   LORazepam (ATIVAN) tablet 1 mg  1 mg Oral PRN Terrica Duecker T, MD       And   ziprasidone (GEODON) injection 20 mg  20 mg Intramuscular PRN Danel Requena T, MD       magnesium hydroxide (MILK OF MAGNESIA) suspension 30 mL  30 mL Oral Daily PRN Richardson Landry, Christal H, NP  melatonin tablet 10 mg  10 mg Oral QHS PRN Caroline Sauger, NP   10 mg at 02/09/23 2107   multivitamin with minerals tablet 1 tablet  1 tablet Oral Daily Tristin Vandeusen, Madie Reno, MD       OLANZapine zydis (ZYPREXA) disintegrating tablet 20 mg  20 mg Oral QHS Donte Lenzo, Madie Reno, MD   20 mg at 02/09/23 2107    Lab Results: No results found for this or any previous visit (from the past 48 hour(s)).  Blood Alcohol level:  Lab Results  Component Value Date   ETH <10 02/03/2023   ETH <5 123XX123    Metabolic Disorder Labs: Lab Results  Component Value Date   HGBA1C 4.8 02/08/2023   MPG 91 02/08/2023   No results found for: "PROLACTIN" Lab Results  Component Value Date   CHOL 146 02/08/2023   TRIG 92 02/08/2023   HDL 54 02/08/2023   CHOLHDL 2.7 02/08/2023    VLDL 18 02/08/2023   LDLCALC 74 02/08/2023   LDLCALC 50 09/08/2015    Physical Findings: AIMS: Facial and Oral Movements Muscles of Facial Expression: None, normal Lips and Perioral Area: None, normal Jaw: None, normal Tongue: None, normal,Extremity Movements Upper (arms, wrists, hands, fingers): None, normal Lower (legs, knees, ankles, toes): None, normal, Trunk Movements Neck, shoulders, hips: None, normal, Overall Severity Severity of abnormal movements (highest score from questions above): None, normal Incapacitation due to abnormal movements: None, normal Patient's awareness of abnormal movements (rate only patient's report): No Awareness, Dental Status Current problems with teeth and/or dentures?: No Does patient usually wear dentures?: No  CIWA:    COWS:     Musculoskeletal: Strength & Muscle Tone: within normal limits Gait & Station: normal Patient leans: N/A  Psychiatric Specialty Exam:  Presentation  General Appearance: No data recorded Eye Contact:No data recorded Speech:No data recorded Speech Volume:No data recorded Handedness:No data recorded  Mood and Affect  Mood:No data recorded Affect:No data recorded  Thought Process  Thought Processes:No data recorded Descriptions of Associations:No data recorded Orientation:No data recorded Thought Content:No data recorded History of Schizophrenia/Schizoaffective disorder:No  Duration of Psychotic Symptoms:Greater than six months  Hallucinations:No data recorded Ideas of Reference:No data recorded Suicidal Thoughts:No data recorded Homicidal Thoughts:No data recorded  Sensorium  Memory:No data recorded Judgment:No data recorded Insight:No data recorded  Executive Functions  Concentration:No data recorded Attention Span:No data recorded Recall:No data recorded Fund of Knowledge:No data recorded Language:No data recorded  Psychomotor Activity  Psychomotor Activity:No data recorded  Assets   Assets:No data recorded  Sleep  Sleep:No data recorded   Physical Exam: Physical Exam Vitals and nursing note reviewed.  Constitutional:      Appearance: Normal appearance.  HENT:     Head: Normocephalic and atraumatic.     Mouth/Throat:     Pharynx: Oropharynx is clear.  Eyes:     Pupils: Pupils are equal, round, and reactive to light.  Cardiovascular:     Rate and Rhythm: Normal rate and regular rhythm.  Pulmonary:     Effort: Pulmonary effort is normal.     Breath sounds: Normal breath sounds.  Abdominal:     General: Abdomen is flat.     Palpations: Abdomen is soft.  Musculoskeletal:        General: Normal range of motion.  Skin:    General: Skin is warm and dry.  Neurological:     General: No focal deficit present.     Mental Status: She is alert. Mental status is at baseline.  Psychiatric:        Attention and Perception: Attention normal.        Mood and Affect: Mood is anxious. Affect is blunt.        Speech: Speech normal.        Behavior: Behavior is cooperative.        Thought Content: Thought content normal.        Cognition and Memory: Cognition normal.    Review of Systems  Constitutional: Negative.   HENT: Negative.    Eyes: Negative.   Respiratory: Negative.    Cardiovascular: Negative.   Gastrointestinal: Negative.   Musculoskeletal: Negative.   Skin: Negative.   Neurological: Negative.   Psychiatric/Behavioral: Negative.     Blood pressure (!) 123/98, pulse 74, temperature 98 F (36.7 C), temperature source Oral, resp. rate 19, height 6\' 1"  (1.854 m), weight 75 kg, SpO2 95 %. Body mass index is 21.81 kg/m.   Treatment Plan Summary: Medication management and Plan spoke with patient today and reviewed outpatient plan.  I think it is likely that we will be able to look at discharge tomorrow and we will start making arrangements for that.  Patient will need to make sure she follows up with an outpatient mental health provider.  Alethia Berthold, MD 02/10/2023, 4:08 PM

## 2023-02-10 NOTE — BH IP Treatment Plan (Signed)
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[place])   Current Medications:  Current Facility-Administered Medications  Medication Dose Route Frequency Provider Last Rate Last Admin   acetaminophen (TYLENOL) tablet 650 mg  650 mg Oral Q6H PRN Bennett, Christal H, NP       alum & mag hydroxide-simeth (MAALOX/MYLANTA) 200-200-20 MG/5ML suspension 30 mL  30 mL Oral Q4H PRN Bennett, Christal H, NP       hydrOXYzine (ATARAX) tablet 50 mg  50 mg Oral Q6H PRN Clapacs, John T, MD       OLANZapine zydis (ZYPREXA) disintegrating tablet 5 mg  5 mg Oral Q8H PRN Clapacs, Madie Reno, MD       And   LORazepam (ATIVAN) tablet 1 mg  1 mg Oral PRN Clapacs, Madie Reno, MD       And   ziprasidone (GEODON) injection 20 mg  20 mg Intramuscular PRN Clapacs, John T, MD       magnesium hydroxide (MILK OF MAGNESIA) suspension 30 mL  30 mL Oral Daily PRN Bennett, Christal H, NP       melatonin tablet 10 mg  10 mg Oral QHS PRN Caroline Sauger, NP   10 mg at 02/09/23 2107   multivitamin with minerals tablet 1 tablet  1 tablet Oral Daily Clapacs, John T, MD       OLANZapine zydis (ZYPREXA) disintegrating tablet 20 mg  20 mg Oral QHS Clapacs, John T, MD   20 mg at 02/09/23 2107   PTA Medications: Medications Prior to Admission  Medication Sig Dispense Refill Last Dose   gabapentin (NEURONTIN) 100 MG capsule Take 100 mg by mouth at bedtime as needed.   Past Week   norethindrone (MICRONOR) 0.35 MG tablet Take 1 tablet by mouth daily.   Past Week   nortriptyline (PAMELOR) 25 MG capsule Take 25 mg by mouth at bedtime.   Past Week   brompheniramine-pseudoephedrine-DM 30-2-10  MG/5ML syrup Take 5 mLs by mouth 4 (four) times daily as needed. (Patient not taking: Reported on 02/03/2023) 120 mL 0 Not Taking    Patient Stressors: Medication change or noncompliance    Patient Strengths: Scientist, research (life sciences)  Supportive family/friends   Treatment Modalities: Medication Management, Group therapy, Case management,  1 to 1 session with clinician, Psychoeducation, Recreational therapy.   Physician Treatment Plan for Primary Diagnosis: Adjustment disorder with mixed disturbance of emotions and conduct Long Term Goal(s): Improvement in symptoms so as ready for discharge   Short Term Goals: Ability to identify and develop effective coping behaviors will improve Compliance with prescribed medications will improve Ability to demonstrate self-control will improve  Medication Management: Evaluate patient's response, side effects, and tolerance of medication regimen.  Therapeutic Interventions: 1 to 1 sessions, Unit Group sessions and Medication administration.  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  Therapeutic Interventions: 1 to 1 sessions, Unit Group sessions and Medication administration.  Evaluation of Outcomes: Progressing   RN Treatment Plan for Primary Diagnosis: Adjustment disorder with mixed disturbance of emotions and conduct Long Term Goal(s): Knowledge of disease and  therapeutic regimen to maintain health will improve  Short Term Goals: Ability to demonstrate self-control, Ability to participate in decision making will improve, Ability to verbalize feelings will improve, Ability to disclose and discuss suicidal ideas, Ability to identify and develop effective coping behaviors will improve, and Compliance with prescribed medications will improve  Medication Management: RN will administer medications as ordered by provider, will assess and evaluate patient's response and provide education to patient for prescribed medication. RN will report any adverse and/or side effects to prescribing provider.  Therapeutic Interventions: 1 on 1 counseling sessions, Psychoeducation, Medication administration, Evaluate responses to treatment, Monitor vital signs and CBGs as ordered, Perform/monitor CIWA, COWS, AIMS and Fall Risk screenings as ordered, Perform wound care treatments as ordered.  Evaluation of Outcomes: Progressing   LCSW Treatment Plan for Primary Diagnosis: Adjustment disorder with mixed disturbance of emotions and conduct Long Term Goal(s): Safe transition to appropriate next level of care at discharge, Engage patient in therapeutic group addressing interpersonal concerns.  Short Term Goals: Engage patient in aftercare planning with referrals and resources, Increase social support, Increase ability to appropriately verbalize feelings, Increase emotional regulation, Facilitate acceptance of mental health diagnosis and concerns, and Increase skills for wellness and recovery  Therapeutic Interventions: Assess for all discharge needs, 1 to 1 time with Social worker, Explore available resources and support systems, Assess for adequacy in community support network, Educate family and significant other(s) on suicide prevention, Complete Psychosocial Assessment, Interpersonal group therapy.  Evaluation of Outcomes: Progressing   Progress in Treatment: Attending  groups: Yes. Participating in groups: Yes. Taking medication as prescribed: Yes. Toleration medication: Yes. Family/Significant other contact made: No, will contact:  once permission is given. Patient understands diagnosis: Yes. Discussing patient identified problems/goals with staff: Yes. Medical problems stabilized or resolved: Yes. Denies suicidal/homicidal ideation: Yes. Issues/concerns per patient self-inventory: No. Other: none  New problem(s) identified: No, Describe:  none identified. Update 02/10/2023:  No changes at this time.    New Short Term/Long Term Goal(s): detox, elimination of symptoms of psychosis, medication management for mood stabilization; elimination of SI thoughts; development of comprehensive mental wellness/sobriety plan.  Update 02/10/2023:  No changes at this time.    Patient Goals:  Pt unable to participate in treatment team meeting as she was sleeping after being given agitation protocol the night prior. Update 02/10/2023:  No changes at this time.    Discharge Plan or Barriers: CSW will assist pt with development of an appropriate aftercare/discharge plan. Update 02/10/2023:  Patient reports plans to return to her home.  She remains unsure about treatment plans after discharge.    Reason for Continuation of Hospitalization: Anxiety Medication stabilization Other; describe paranoia.   Estimated Length of Stay: 1-7 days  Last 3 [place] Suicide Severity Risk Score: Flowsheet Row Admission (Current) from 02/04/2023 in [place] ED from 02/03/2023 in [facility] Emergency Department at [facility] ED from 11/13/2022 in [facility] Emergency Department at [place] No Risk No Risk No Risk       Last PHQ 2/9 Scores:     No data to display  Scribe for Treatment Team: Rozann Lesches, Marlinda Mike 02/10/2023 10:05 AM

## 2023-02-10 NOTE — Progress Notes (Signed)
   02/09/23 2300  Psych Admission Type (Psych Patients Only)  Admission Status Involuntary  Psychosocial Assessment  Patient Complaints None  Eye Contact Suspiciousness  Facial Expression Worried  Affect Appropriate to circumstance  Speech Soft  Interaction Guarded  Motor Activity Slow  Appearance/Hygiene Unremarkable  Behavior Characteristics Cooperative  Mood Preoccupied  Thought Process  Coherency WDL  Content Paranoia  Delusions None reported or observed  Perception WDL  Hallucination None reported or observed  Judgment Impaired  Confusion None  Danger to Self  Current suicidal ideation? Denies  Danger to Others  Danger to Others None reported or observed

## 2023-02-10 NOTE — Group Note (Signed)
[facility] LCSW Group Therapy Note    Group Date: 02/10/2023 Start Time: 1300 End Time: 1400  Type of Therapy and Topic:  Group Therapy:  Overcoming Obstacles  Participation Level:  [facility] PARTICIPATION LEVEL: Did Not Attend  Mood:  Description of Group:   In this group patients will be encouraged to explore what they see as obstacles to their own wellness and recovery. They will be guided to discuss their thoughts, feelings, and behaviors related to these obstacles. The group will process together ways to cope with barriers, with attention given to specific choices patients can make. Each patient will be challenged to identify changes they are motivated to make in order to overcome their obstacles. This group will be process-oriented, with patients participating in exploration of their own experiences as well as giving and receiving support and challenge from other group members.  Therapeutic Goals: 1. Patient will identify personal and current obstacles as they relate to admission. 2. Patient will identify barriers that currently interfere with their wellness or overcoming obstacles.  3. Patient will identify feelings, thought process and behaviors related to these barriers. 4. Patient will identify two changes they are willing to make to overcome these obstacles:    Summary of Patient Progress Patient did not attend group despite encouraged participation.    Therapeutic Modalities:   Cognitive Behavioral Therapy Solution Focused Therapy Motivational Interviewing Relapse Prevention Therapy   Durenda Hurt, [place]

## 2023-02-10 NOTE — [facility] Group Notes (Signed)
[facility] Group Notes:  (Nursing/MHT/Case Management/Adjunct)  Date:  02/10/2023  Time:  8:42 PM  Type of Therapy:  Group Therapy  Participation Level:  Active  Participation Quality:  Appropriate  Affect:  Appropriate  Cognitive:  Alert and Appropriate  Insight:  Good  Engagement in Group:  Engaged and Improving  Modes of Intervention:  Discussion and Education  Summary of Progress/Problems:  [name],Tricia Moran 02/10/2023, 8:42 PM

## 2023-02-11 DIAGNOSIS — F4325 Adjustment disorder with mixed disturbance of emotions and conduct: Secondary | ICD-10-CM | POA: Diagnosis not present

## 2023-02-11 MED ORDER — HYDROXYZINE HCL 50 MG PO TABS: 50.0000 mg | ORAL_TABLET | Freq: Four times a day (QID) | ORAL | 1 refills | Status: AC | PRN

## 2023-02-11 MED ORDER — OLANZAPINE 20 MG PO TBDP: 20.0000 mg | ORAL_TABLET | Freq: Every day | ORAL | 1 refills | Status: AC

## 2023-02-11 MED ORDER — MELATONIN 10 MG PO TABS: 10.0000 mg | ORAL_TABLET | Freq: Every evening | ORAL | 1 refills | Status: AC | PRN

## 2023-02-11 NOTE — Discharge Summary (Signed)
Physician Discharge Summary Note  Patient:  Tricia Moran is an 37 y.o., female MRN:  [id number] DOB:  1986/09/19 Patient phone:  [phone] (home)  Patient address:   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  Past Medical History: History reviewed. No pertinent past medical history. History reviewed. No pertinent surgical history. Family History: History reviewed. No pertinent family history. Family Psychiatric  History: See previous Social History:  Social History   Substance and Sexual Activity  Alcohol Use Yes     Social History   Substance and Sexual Activity  Drug Use Never    Social History   Socioeconomic History   Marital status: Married    Spouse name: Not on file   Number of children: Not on file   Years of education: Not on file   Highest education level: Not on file  Occupational History   Not on file  Tobacco Use   Smoking status: Never   Smokeless tobacco: Not on file  Substance and Sexual Activity   Alcohol use: Yes   Drug use: Never   Sexual activity: Not Currently  Other Topics Concern   Not on file  Social History Narrative   Not on file   Social  Determinants of Health   Financial Resource Strain: Not on file  Food Insecurity: No Food Insecurity (02/05/2023)   Hunger Vital Sign    Worried About Running Out of Food in the Last Year: Never true    Ran Out of Food in the Last Year: Never true  Transportation Needs: Patient Declined (02/05/2023)   PRAPARE - Hydrologist (Medical): Patient declined    Lack of Transportation (Non-Medical): Patient declined  Physical Activity: Not on file  Stress: Not on file  Social Connections: Not on file    Hospital Course: Patient was admitted to the psychiatric unit and kept on 15-minute checks.  She did not display any dangerous or violent behavior.  Patient initially presented as very suspicious and somewhat uncooperative.  She did agree however to reinitiate olanzapine which had been used for treatment years ago under similar circumstances.  She has been compliant with the medicine which has been titrated up to a dose of 20 mg at night.  She appears to be tolerating this well.  Gradually affect seems to become less suspicious and anxious and irritable.  She continues to deny any current hallucinations continues to deny any suicidal or homicidal ideation and is able to express reasonable plans for the future.  I spoke with her primary care doctor shortly before discharge to update her on the plan.  Patient will be discharged with referral to outpatient mental health care and prescription for medicine  Physical Findings: AIMS: Facial and Oral Movements Muscles of Facial Expression: None, normal Lips and Perioral  Area: None, normal Jaw: None, normal Tongue: None, normal,Extremity Movements Upper (arms, wrists, hands, fingers): None, normal Lower (legs, knees, ankles, toes): None, normal, Trunk Movements Neck, shoulders, hips: None, normal, Overall Severity Severity of abnormal movements (highest score from questions above): None, normal Incapacitation due to abnormal  movements: None, normal Patient's awareness of abnormal movements (rate only patient's report): No Awareness, Dental Status Current problems with teeth and/or dentures?: No Does patient usually wear dentures?: No  CIWA:    COWS:     Musculoskeletal: Strength & Muscle Tone: within normal limits Gait & Station: normal Patient leans: N/A   Psychiatric Specialty Exam:  Presentation  General Appearance: No data recorded Eye Contact:No data recorded Speech:No data recorded Speech Volume:No data recorded Handedness:No data recorded  Mood and Affect  Mood:No data recorded Affect:No data recorded  Thought Process  Thought Processes:No data recorded Descriptions of Associations:No data recorded Orientation:No data recorded Thought Content:No data recorded History of Schizophrenia/Schizoaffective disorder:No  Duration of Psychotic Symptoms:Greater than six months  Hallucinations:No data recorded Ideas of Reference:No data recorded Suicidal Thoughts:No data recorded Homicidal Thoughts:No data recorded  Sensorium  Memory:No data recorded Judgment:No data recorded Insight:No data recorded  Executive Functions  Concentration:No data recorded Attention Span:No data recorded Recall:No data recorded Fund of Knowledge:No data recorded Language:No data recorded  Psychomotor Activity  Psychomotor Activity:No data recorded  Assets  Assets:No data recorded  Sleep  Sleep:No data recorded   Physical Exam: Physical Exam Vitals and nursing note reviewed.  Constitutional:      Appearance: Normal appearance.  HENT:     Head: Normocephalic and atraumatic.     Mouth/Throat:     Pharynx: Oropharynx is clear.  Eyes:     Pupils: Pupils are equal, round, and reactive to light.  Cardiovascular:     Rate and Rhythm: Normal rate and regular rhythm.  Pulmonary:     Effort: Pulmonary effort is normal.     Breath sounds: Normal breath sounds.  Abdominal:     General: Abdomen is  flat.     Palpations: Abdomen is soft.  Musculoskeletal:        General: Normal range of motion.  Skin:    General: Skin is warm and dry.  Neurological:     General: No focal deficit present.     Mental Status: She is alert. Mental status is at baseline.  Psychiatric:        Attention and Perception: Attention normal.        Mood and Affect: Mood is anxious. Affect is blunt.        Speech: Speech normal.        Behavior: Behavior is cooperative.        Thought Content: Thought content normal.        Cognition and Memory: Cognition normal.        Judgment: Judgment normal.    Review of Systems  Constitutional: Negative.   HENT: Negative.    Eyes: Negative.   Respiratory: Negative.    Cardiovascular: Negative.   Gastrointestinal: Negative.   Musculoskeletal: Negative.   Skin: Negative.   Neurological: Negative.   Psychiatric/Behavioral: Negative.     Blood pressure 120/74, pulse 62, temperature 98.2 F (36.8 C), temperature source Oral, resp. rate 18, height 6\' 1"  (1.854 m), weight 75 kg, SpO2 100 %. Body mass index is 21.81 kg/m.   Social History   Tobacco Use  Smoking Status Never  Smokeless Tobacco Not on file   Tobacco Cessation:  N/A, patient  does not currently use tobacco products   Blood Alcohol level:  Lab Results  Component Value Date   ETH <10 02/03/2023   ETH <5 123XX123    Metabolic Disorder Labs:  Lab Results  Component Value Date   HGBA1C 4.8 02/08/2023   MPG 91 02/08/2023   No results found for: "PROLACTIN" Lab Results  Component Value Date   CHOL 146 02/08/2023   TRIG 92 02/08/2023   HDL 54 02/08/2023   CHOLHDL 2.7 02/08/2023   VLDL 18 02/08/2023   LDLCALC 74 02/08/2023   LDLCALC 50 09/08/2015    See Psychiatric Specialty Exam and Suicide Risk Assessment completed by Attending Physician prior to discharge.  Discharge destination:  Home  Is patient on multiple antipsychotic therapies at discharge:  No   Has Patient had three or  more failed trials of antipsychotic monotherapy by history:  No  Recommended Plan for Multiple Antipsychotic Therapies: NA  Discharge Instructions     Diet - low sodium heart healthy   Complete by: As directed    Increase activity slowly   Complete by: As directed       Allergies as of 02/11/2023   No Known Allergies      Medication List     STOP taking these medications    brompheniramine-pseudoephedrine-DM 30-2-10 MG/5ML syrup   nortriptyline 25 MG capsule Commonly known as: PAMELOR       TAKE these medications      Indication  gabapentin 100 MG capsule Commonly known as: NEURONTIN Take 100 mg by mouth at bedtime as needed.  Indication: Neuropathic Pain   hydrOXYzine 50 MG tablet Commonly known as: ATARAX Take 1 tablet (50 mg total) by mouth every 6 (six) hours as needed for anxiety.  Indication: Feeling Anxious   Melatonin 10 MG Tabs Take 10 mg by mouth at bedtime as needed.  Indication: Trouble Sleeping   norethindrone 0.35 MG tablet Commonly known as: MICRONOR Take 1 tablet by mouth daily.  Indication: Birth Control Treatment   OLANZapine zydis 20 MG disintegrating tablet Commonly known as: ZYPREXA Take 1 tablet (20 mg total) by mouth at bedtime.  Indication: Delusions of [place] Follow up.   Why: Although you declined scheduled outpatient appointment, this is a resource if you change your mind. They have walk-in hours on Mondays, Wednesdays, and Fridays 8am-4pm. Clients are seen on a first come first served basis and it is recommended that you arrive before 7:30am to be seen the same day. Contact information: [place] [address] [phone]                 Follow-up recommendations:  Other:  Recommend follow-up with psychiatric care and referral has been done.  Prescriptions provided at discharge.  Psychoeducation completed.  Comments: See  above  Signed: Alethia Berthold, MD 02/11/2023, 10:22 AM

## 2023-02-11 NOTE — Plan of Care (Signed)
  Problem: Education: Goal: Knowledge of General Education information will improve Description: Including pain rating scale, medication(s)/side effects and non-pharmacologic comfort measures Outcome: Adequate for Discharge   Problem: Health Behavior/Discharge Planning: Goal: Ability to manage health-related needs will improve Outcome: Adequate for Discharge   Problem: Clinical Measurements: Goal: Ability to maintain clinical measurements within normal limits will improve Outcome: Adequate for Discharge Goal: Will remain free from infection Outcome: Adequate for Discharge Goal: Diagnostic test results will improve Outcome: Adequate for Discharge Goal: Respiratory complications will improve Outcome: Adequate for Discharge Goal: Cardiovascular complication will be avoided Outcome: Adequate for Discharge   Problem: Activity: Goal: Risk for activity intolerance will decrease Outcome: Adequate for Discharge   Problem: Nutrition: Goal: Adequate nutrition will be maintained Outcome: Adequate for Discharge   Problem: Coping: Goal: Level of anxiety will decrease Outcome: Adequate for Discharge   Problem: Elimination: Goal: Will not experience complications related to bowel motility Outcome: Adequate for Discharge Goal: Will not experience complications related to urinary retention Outcome: Adequate for Discharge   Problem: Pain Managment: Goal: General experience of comfort will improve Outcome: Adequate for Discharge   Problem: Safety: Goal: Ability to remain free from injury will improve Outcome: Adequate for Discharge   Problem: Skin Integrity: Goal: Risk for impaired skin integrity will decrease Outcome: Adequate for Discharge   Problem: Safety: Goal: Violent Restraint(s) Outcome: Adequate for Discharge   

## 2023-02-11 NOTE — Progress Notes (Signed)
Patient continues to present anxious. Noted playing cards in  dayroom with peers. Denies any SI, Hi, AVH. Refused multivitamin this shift. Requested melatonin for sleep with good relief. Encouragement and support provided. Safety checks maintained. Medications given as prescribed. Pt receptive and remains safe on unit with q 15 min checks.

## 2023-02-11 NOTE — Plan of Care (Signed)
  Problem: Nutrition: Goal: Adequate nutrition will be maintained Outcome: Progressing   Problem: Coping: Goal: Level of anxiety will decrease Outcome: Progressing   Problem: Safety: Goal: Ability to remain free from injury will improve Outcome: Progressing   Problem: Safety: Goal: Violent Restraint(s) Outcome: Progressing

## 2023-02-11 NOTE — [facility] Suicide Risk Assessment (Signed)
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[place])   Total Time spent with patient: 30 minutes  Musculoskeletal: Strength & Muscle Tone: within normal limits Gait & Station: normal Patient leans: N/A  Psychiatric Specialty Exam  Presentation  General Appearance: No data recorded Eye Contact:No data recorded Speech:No data recorded Speech Volume:No data recorded Handedness:No data recorded  Mood and Affect  Mood:No data recorded Duration of Depression Symptoms: No data recorded Affect:No data recorded  Thought Process  Thought Processes:No data recorded Descriptions of Associations:No data recorded Orientation:No data recorded Thought Content:No data recorded History of Schizophrenia/Schizoaffective disorder:No  Duration of Psychotic Symptoms:Greater than six months  Hallucinations:No data recorded Ideas of Reference:No data recorded Suicidal Thoughts:No data recorded Homicidal Thoughts:No data recorded  Sensorium  Memory:No data recorded Judgment:No data recorded Insight:No data recorded  Executive Functions  Concentration:No data recorded Attention Span:No data recorded Recall:No data recorded Fund of Knowledge:No data recorded Language:No data recorded  Psychomotor Activity  Psychomotor Activity:No data recorded  Assets  Assets:No data recorded  Sleep  Sleep:No data recorded  Physical Exam: Physical Exam Vitals and nursing note reviewed.  Constitutional:      Appearance: Normal appearance.  HENT:     Head: Normocephalic and atraumatic.     Mouth/Throat:     Pharynx: Oropharynx is clear.  Eyes:     Pupils: Pupils are equal, round, and reactive to light.  Cardiovascular:      Rate and Rhythm: Normal rate and regular rhythm.  Pulmonary:     Effort: Pulmonary effort is normal.     Breath sounds: Normal breath sounds.  Abdominal:     General: Abdomen is flat.     Palpations: Abdomen is soft.  Musculoskeletal:        General: Normal range of motion.  Skin:    General: Skin is warm and dry.  Neurological:     General: No focal deficit present.     Mental Status: She is alert. Mental status is at baseline.  Psychiatric:        Attention and Perception: Attention normal.        Mood and Affect: Mood is anxious.        Speech: Speech normal.        Behavior: Behavior is cooperative.        Thought Content: Thought content normal.        Cognition and Memory: Cognition normal.    Review of Systems  Constitutional: Negative.   HENT: Negative.    Eyes: Negative.   Respiratory: Negative.    Cardiovascular: Negative.   Gastrointestinal: Negative.   Musculoskeletal: Negative.   Skin: Negative.   Neurological: Negative.   Psychiatric/Behavioral: Negative.     Blood pressure 120/74, pulse 62, temperature 98.2 F (36.8 C), temperature source Oral, resp. rate 18, height 6\' 1"  (1.854 m), weight 75 kg, SpO2 100 %. Body mass index is 21.81 kg/m.  Mental Status Per Nursing Assessment::   On Admission:  NA  Demographic Factors:  Caucasian  Loss Factors: Financial problems/change in socioeconomic status  Historical Factors: NA  Risk Reduction Factors:   Responsible for children under 64 years of age, Sense of responsibility to family, Employed, Positive social support, and Positive therapeutic relationship  Continued Clinical Symptoms:  Currently Psychotic  Cognitive  Features That Contribute To Risk:  None    Suicide Risk:  Minimal: No identifiable suicidal ideation.  Patients presenting with no risk factors but with morbid ruminations; may be classified as minimal risk based on the severity of the depressive symptoms   Follow-up Information      [facility].   Why: Although you declined scheduled outpatient appointment, this is a resource if you change your mind. They have walk-in hours on Mondays, Wednesdays, and Fridays 8am-4pm. Clients are seen on a first come first served basis and it is recommended that you arrive before 7:30am to be seen the same day. Contact information: [place] [address] [phone]                 Plan Of Care/Follow-up recommendations:  Other:  Patient has not shown any dangerous behavior in the hospital.  She has consistently denied any suicidal or homicidal thought.  She appears to be focused on positive plans for the future.  She has been compliant with medicine.  She will be given prescriptions at discharge and referral to outpatient mental health treatment with education about the importance of staying compliant with medicine.  Alethia Berthold, MD 02/11/2023, 10:18 AM

## 2023-02-11 NOTE — BHH Counselor (Signed)
CSW met with pt briefly to discuss discharge plans. She endorsed plans to return home upon discharge and shared that she has transportation. Pt declined scheduled follow up appointment, stating that she will schedule her own appointment after discharge. She has clothes to wear home. Pt denied any use of tobacco products or need for cessation. She denied any substance use or need for such services. Pt asked when she could call her ride. CSW encouraged her to speak with her nurse as they compile all discharge paperwork together to go through it with pt and will also let pt know when to call her ride. No other concerns expressed. Contact ended without incident.   Chalmers Guest. Guerry Bruin, MSW, [place], [place] 02/11/2023 9:34 AM

## 2023-02-11 NOTE — Group Note (Signed)
Recreation Therapy Group Note   Group Topic:Healthy Support Systems  Group Date: 02/11/2023 Start Time: 1000 End Time: 1055 Facilitators: Vilma Prader, LRT, CTRS Location:  Craft Room  Group Description: Straw Bridge. Patients were given 10 plastic drinking straws and an equal length of masking tape. Using the materials provided, patients were instructed to build a free-standing bridge-like structure to suspend an everyday item (ex: deck of cards) off the floor or table surface. All materials were required to be used in Conservation officer, nature. LRT facilitated post-activity discussion reviewing the importance of having strong and healthy support systems in our lives. LRT discussed how the people in our lives serve as the tape and the book we placed on top of our straw structure are the stressors we face in daily life. LRT and pts discussed what happens in our life when things get too heavy for [place], and we don't have strong supports outside of the hospital. Pt shared 2 of their healthy supports aloud in the group.   Affect/Mood: N/A   Participation Level: Did not attend    Clinical Observations/Individualized Feedback: [place] did not attend group due to resting in her room.  Plan: Continue to engage patient in RT group sessions 2-3x/week.   Vilma Prader, LRT, CTRS 02/11/2023 11:26 AM

## 2023-02-11 NOTE — Progress Notes (Signed)
Patient ID: Tricia Moran, female   DOB: 07/26/1986, 37 y.o.   MRN: [id number] Patient denies SI/HI/AVH. Belongings were returned to patient. Discharge instructions  including medication and follow up information were reviewed with patient and understanding was verbalized. Patient was not observed to be in distress at time of discharge. Patient was escorted out with staff to medical mall to be transported home by family member.

## 2023-02-11 NOTE — Progress Notes (Signed)
  [facility] Adult Case Management Discharge Plan :  Will you be returning to the same living situation after discharge:  Yes,  pt plans to return home upon discharge. At discharge, do you have transportation home?: Yes,  pt support system to provide transportation.  Do you have the ability to pay for your medications: Yes,  [employer].   Release of information consent forms completed and in the chart;  Patient's signature needed at discharge.  Patient to Follow up at:  Follow-up Information     [facility].   Why: Although you declined scheduled outpatient appointment, this is a resource if you change your mind. They have walk-in hours on Mondays, Wednesdays, and Fridays 8am-4pm. Clients are seen on a first come first served basis and it is recommended that you arrive before 7:30am to be seen the same day. Contact information: [place] [address] [phone]                 Next level of care provider has access to [place] and Suicide Prevention discussed: Yes,  SPE completed with pt.      Has patient been referred to the Quitline?: N/A patient is not a smoker  Patient has been referred for addiction treatment: Pt. refused referral  Tricia Harris, LCSW 02/11/2023, 9:34 AM

## 2024-01-07 ENCOUNTER — Other Ambulatory Visit: Payer: Self-pay | Admitting: Family Medicine

## 2024-01-07 DIAGNOSIS — R918 Other nonspecific abnormal finding of lung field: Secondary | ICD-10-CM

## 2024-01-09 ENCOUNTER — Ambulatory Visit
Admission: RE | Admit: 2024-01-09 | Discharge: 2024-01-09 | Disposition: A | Discharge status: Home / Self Care | Payer: 59 | Source: Ambulatory Visit | Attending: Family Medicine | Admitting: Family Medicine

## 2024-01-09 DIAGNOSIS — R918 Other nonspecific abnormal finding of lung field: Secondary | ICD-10-CM

## 2024-07-12 ENCOUNTER — Other Ambulatory Visit: Payer: Self-pay | Admitting: Pulmonary Disease

## 2024-07-12 DIAGNOSIS — A310 Pulmonary mycobacterial infection: Secondary | ICD-10-CM

## 2024-07-19 ENCOUNTER — Encounter: Payer: Self-pay | Admitting: Pulmonary Disease

## 2024-07-19 ENCOUNTER — Other Ambulatory Visit: Payer: Self-pay | Admitting: Pulmonary Disease

## 2024-07-19 DIAGNOSIS — A310 Pulmonary mycobacterial infection: Secondary | ICD-10-CM

## 2024-07-23 ENCOUNTER — Ambulatory Visit
Admission: RE | Admit: 2024-07-23 | Discharge: 2024-07-23 | Disposition: A | Discharge status: Home / Self Care | Source: Ambulatory Visit | Attending: Pulmonary Disease | Admitting: Pulmonary Disease

## 2024-07-23 DIAGNOSIS — A310 Pulmonary mycobacterial infection: Secondary | ICD-10-CM
